# Patient Record
Sex: Female | Born: 1960
Health system: Southern US, Community
[De-identification: ages and names within clinical notes are randomized; demographics above are authoritative.]

## PROBLEM LIST (undated history)

## (undated) DIAGNOSIS — N390 Urinary tract infection, site not specified: Secondary | ICD-10-CM

## (undated) DIAGNOSIS — Z8542 Personal history of malignant neoplasm of other parts of uterus: Secondary | ICD-10-CM

## (undated) DIAGNOSIS — Z803 Family history of malignant neoplasm of breast: Secondary | ICD-10-CM

## (undated) DIAGNOSIS — R7303 Prediabetes: Secondary | ICD-10-CM

## (undated) DIAGNOSIS — E042 Nontoxic multinodular goiter: Secondary | ICD-10-CM

## (undated) DIAGNOSIS — K219 Gastro-esophageal reflux disease without esophagitis: Secondary | ICD-10-CM

## (undated) DIAGNOSIS — K8020 Calculus of gallbladder without cholecystitis without obstruction: Secondary | ICD-10-CM

## (undated) DIAGNOSIS — I4891 Unspecified atrial fibrillation: Principal | ICD-10-CM

## (undated) DIAGNOSIS — B019 Varicella without complication: Secondary | ICD-10-CM

## (undated) HISTORY — PX: TONSILLECTOMY: SUR1361

## (undated) HISTORY — DX: Gastro-esophageal reflux disease without esophagitis: K21.9

## (undated) HISTORY — DX: Family history of malignant neoplasm of breast: Z80.3

## (undated) HISTORY — DX: Calculus of gallbladder without cholecystitis without obstruction: K80.20

## (undated) HISTORY — DX: Varicella without complication: B01.9

## (undated) HISTORY — DX: Urinary tract infection, site not specified: N39.0

## (undated) HISTORY — DX: Personal history of malignant neoplasm of other parts of uterus: Z85.42

---

## 1998-08-12 ENCOUNTER — Other Ambulatory Visit: Admission: RE | Admit: 1998-08-12 | Discharge: 1998-08-12 | Payer: Self-pay | Admitting: Obstetrics and Gynecology

## 1999-07-20 ENCOUNTER — Inpatient Hospital Stay (HOSPITAL_COMMUNITY): Admission: AD | Admit: 1999-07-20 | Discharge: 1999-07-22 | Payer: Self-pay | Admitting: Obstetrics and Gynecology

## 1999-08-17 ENCOUNTER — Other Ambulatory Visit: Admission: RE | Admit: 1999-08-17 | Discharge: 1999-08-17 | Payer: Self-pay | Admitting: Obstetrics and Gynecology

## 2000-09-06 ENCOUNTER — Other Ambulatory Visit: Admission: RE | Admit: 2000-09-06 | Discharge: 2000-09-06 | Payer: Self-pay | Admitting: Obstetrics and Gynecology

## 2001-09-10 ENCOUNTER — Other Ambulatory Visit: Admission: RE | Admit: 2001-09-10 | Discharge: 2001-09-10 | Payer: Self-pay | Admitting: Obstetrics and Gynecology

## 2002-09-15 ENCOUNTER — Other Ambulatory Visit: Admission: RE | Admit: 2002-09-15 | Discharge: 2002-09-15 | Payer: Self-pay | Admitting: Obstetrics and Gynecology

## 2003-09-22 ENCOUNTER — Other Ambulatory Visit: Admission: RE | Admit: 2003-09-22 | Discharge: 2003-09-22 | Payer: Self-pay | Admitting: Obstetrics and Gynecology

## 2004-11-01 ENCOUNTER — Other Ambulatory Visit: Admission: RE | Admit: 2004-11-01 | Discharge: 2004-11-01 | Payer: Self-pay | Admitting: Endocrinology

## 2010-08-08 ENCOUNTER — Ambulatory Visit: Payer: Self-pay | Admitting: Genetic Counselor

## 2010-10-20 ENCOUNTER — Ambulatory Visit: Payer: Self-pay | Admitting: Genetic Counselor

## 2011-02-21 ENCOUNTER — Ambulatory Visit: Payer: BC Managed Care – PPO | Attending: Gynecology | Admitting: Gynecology

## 2011-02-21 DIAGNOSIS — C549 Malignant neoplasm of corpus uteri, unspecified: Secondary | ICD-10-CM | POA: Insufficient documentation

## 2011-02-21 DIAGNOSIS — Z806 Family history of leukemia: Secondary | ICD-10-CM | POA: Insufficient documentation

## 2011-02-21 DIAGNOSIS — Z79899 Other long term (current) drug therapy: Secondary | ICD-10-CM | POA: Insufficient documentation

## 2011-02-21 DIAGNOSIS — Z803 Family history of malignant neoplasm of breast: Secondary | ICD-10-CM | POA: Insufficient documentation

## 2011-02-21 DIAGNOSIS — Z8049 Family history of malignant neoplasm of other genital organs: Secondary | ICD-10-CM | POA: Insufficient documentation

## 2011-02-23 NOTE — Consult Note (Signed)
[name], PENADO NO.:  [id number]  MEDICAL RECORD NO.:  [id number]           PATIENT TYPE:  O  LOCATION:  DAY                          FACILITY:  [facility]  PHYSICIAN:  De Blanch, M.D.DATE OF BIRTH:  05-20-1961  DATE OF CONSULTATION:  02/21/2011 DATE OF DISCHARGE:                                CONSULTATION   GYN/Oncology Clinic  CHIEF COMPLAINT:  Endometrial cancer.  HISTORY OF PRESENT ILLNESS:  [age]-year-old white married female seen in consultation at the request of Dr. Juliene Pina regarding management of a newly diagnosed well-differentiated endometrial carcinoma.  The patient continues to have regular cyclic menses, although over the last several months, have become heavy and more recently she developed intermenstrual spotting.  She underwent an ultrasound that showed a 1.5 centimeter polyp and irregular wall thickening.  Endometrial biopsies obtained showing a grade I endometrial carcinoma in the background of complex endometrial hyperplasia with atypia.  Patient has occasional cramping.  Has no other GI or GU symptoms. Functional status is excellent.  PAST MEDICAL HISTORY:  None.  PAST SURGICAL HISTORY:  Tonsils adenoidectomy.  CURRENT MEDICATIONS: 1. Celexa. 2. Zyrtec p.r.n.  DRUG ALLERGIES:  NAPROSYN (swelling).  GYNECOLOGIC HISTORY:  Gravida 4, para 3.  FAMILY HISTORY:  The patient's mother died of breast cancer in her 30s. There is no colon cancer in the family.  She has a maternal aunt with endometrial cancer and a paternal aunt with leukemia in her 9s.  SOCIAL HISTORY:  The patient is married.  She is paralegal.  She does not smoke.  REVIEW OF SYSTEMS:  Ten-point comprehensive review of systems negative except as noted above.  PHYSICAL EXAMINATION:  HEENT:  Negative. NECK:  Supple without thyromegaly.  There is no supraclavicular or inguinal adenopathy. ABDOMEN:  Soft, nontender.  No masses, organomegaly, ascites  or hernias noted. PELVIC EXAMINATION:  EGBUS, vagina, bladder, urethra are normal.  Cervix and uterus seem to be normal as well.  There is some blood from the cervical os.  No other lesions are noted.  Uterus is anterior and normal in shape, size and consistency.  Rectovaginal exam confirms.  IMPRESSION:  Grade I endometrial carcinoma associated with complex hyperplasia with atypia.  PLAN:  I discussed with the patient and her husband management options. I emphasized the cornerstone of therapy is to perform a hysterectomy and bilateral salpingo-oophorectomy.  Pros and cons of open versus laparoscopic versus robotic surgery have been discussed and the patient wishes to proceed with robotic surgery, which could be performed by Dr. [name] next Tuesday.  The risks of surgery including hemorrhage, infection, injury to adjacent viscera, thrombolic complications, anesthetic risks were outlined.  We also discussed the remote possibility that postoperative radiation therapy would be recommended based on surgical pathologic findings.  All the patient's questions were answered and we will proceed with planning and having her prepared for surgery on Feb 28, 2011.     De Blanch, M.D.     DC/MEDQ  D:  02/21/2011  T:  02/21/2011  Job:  [id number]  cc:   Telford Nab, R.N. [address] [place], [place] [address]  Armanda Heritage  Juliene Pina, MD Fax: [phone]  Electronically Signed by De Blanch M.D. on 02/23/2011 10:09:15 AM

## 2011-02-24 ENCOUNTER — Other Ambulatory Visit: Payer: Self-pay | Admitting: Gynecologic Oncology

## 2011-02-24 ENCOUNTER — Ambulatory Visit (HOSPITAL_COMMUNITY)
Admission: RE | Admit: 2011-02-24 | Discharge: 2011-02-24 | Disposition: A | Discharge status: Home / Self Care | Payer: BC Managed Care – PPO | Source: Ambulatory Visit | Attending: Obstetrics & Gynecology | Admitting: Obstetrics & Gynecology

## 2011-02-24 ENCOUNTER — Other Ambulatory Visit: Payer: Self-pay | Admitting: Obstetrics & Gynecology

## 2011-02-24 ENCOUNTER — Encounter (HOSPITAL_COMMUNITY): Payer: BC Managed Care – PPO

## 2011-02-24 DIAGNOSIS — Z01811 Encounter for preprocedural respiratory examination: Secondary | ICD-10-CM

## 2011-02-24 DIAGNOSIS — Z01812 Encounter for preprocedural laboratory examination: Secondary | ICD-10-CM | POA: Insufficient documentation

## 2011-02-24 DIAGNOSIS — Z01818 Encounter for other preprocedural examination: Secondary | ICD-10-CM | POA: Insufficient documentation

## 2011-02-24 LAB — COMPREHENSIVE METABOLIC PANEL
ALT: 17 U/L (ref 0–35)
AST: 15 U/L (ref 0–37)
Albumin: 3.8 g/dL (ref 3.5–5.2)
Alkaline Phosphatase: 92 U/L (ref 39–117)
Calcium: 8.6 mg/dL (ref 8.4–10.5)
GFR calc Af Amer: >60 mL/min (ref >60)
Potassium: 4.4 mEq/L (ref 3.5–5.1)
Sodium: 139 mEq/L (ref 135–145)
Total Protein: 6.9 g/dL (ref 6.0–8.3)

## 2011-02-24 LAB — CBC
HCT: 38.8 % (ref 36.0–46.0)
Platelets: 249 10*3/uL (ref 150–400)
RBC: 4.27 MIL/uL (ref 3.87–5.11)
RDW: 14.3 % (ref 11.5–15.5)
WBC: 7.9 10*3/uL (ref 4.0–10.5)

## 2011-02-24 LAB — DIFFERENTIAL
Basophils Absolute: 0.1 10*3/uL (ref 0.0–0.1)
Eosinophils Absolute: 0.5 10*3/uL (ref 0.0–0.7)
Eosinophils Relative: 6 % — ABNORMAL HIGH (ref 0–5)
Lymphocytes Relative: 24 % (ref 12–46)
Lymphs Abs: 1.9 10*3/uL (ref 0.7–4.0)
Neutrophils Relative %: 63 % (ref 43–77)

## 2011-02-24 LAB — TYPE AND SCREEN: ABO/RH(D): A POS

## 2011-02-24 LAB — SURGICAL PCR SCREEN: Staphylococcus aureus: POSITIVE — AB

## 2011-02-28 ENCOUNTER — Other Ambulatory Visit: Payer: Self-pay | Admitting: Gynecologic Oncology

## 2011-02-28 ENCOUNTER — Inpatient Hospital Stay (HOSPITAL_COMMUNITY)
Admission: RE | Admit: 2011-02-28 | Discharge: 2011-03-03 | DRG: 354 | Disposition: A | Discharge status: Home / Self Care | Payer: BC Managed Care – PPO | Source: Ambulatory Visit | Attending: Obstetrics & Gynecology | Admitting: Obstetrics & Gynecology

## 2011-02-28 DIAGNOSIS — C549 Malignant neoplasm of corpus uteri, unspecified: Principal | ICD-10-CM | POA: Diagnosis present

## 2011-02-28 DIAGNOSIS — K8020 Calculus of gallbladder without cholecystitis without obstruction: Secondary | ICD-10-CM

## 2011-02-28 DIAGNOSIS — E875 Hyperkalemia: Secondary | ICD-10-CM | POA: Diagnosis not present

## 2011-02-28 HISTORY — DX: Calculus of gallbladder without cholecystitis without obstruction: K80.20

## 2011-02-28 HISTORY — PX: ABDOMINAL HYSTERECTOMY: SHX81

## 2011-03-01 LAB — BASIC METABOLIC PANEL
BUN: 5 mg/dL — ABNORMAL LOW (ref 6–23)
CO2: 28 mEq/L (ref 19–32)
GFR calc Af Amer: >60 mL/min (ref >60)
GFR calc non Af Amer: >60 mL/min (ref >60)
Glucose, Bld: 124 mg/dL — ABNORMAL HIGH (ref 70–99)

## 2011-03-01 LAB — CBC
HCT: 32.7 % — ABNORMAL LOW (ref 36.0–46.0)
Hemoglobin: 10.8 g/dL — ABNORMAL LOW (ref 12.0–15.0)
MCH: 30.1 pg (ref 26.0–34.0)
MCHC: 33 g/dL (ref 30.0–36.0)

## 2011-03-01 NOTE — Consult Note (Signed)
  [name], Melinda Carlson NO.:  [id number]  MEDICAL RECORD NO.:  [id number]           PATIENT TYPE:  O  LOCATION:  DAYL                         FACILITY:  [facility]  PHYSICIAN:  Currie Paris, M.D.DATE OF BIRTH:  05-01-1961  DATE OF CONSULTATION: DATE OF DISCHARGE:                                CONSULTATION   REASON FOR CONSULTATION:  Gallstones.  HISTORY OF PRESENT ILLNESS:  This is a 50 year old lady I was asked to see in the operating room.  She is undergoing open hysterectomy and on palpation of the abdomen, some abnormality was felt in the gallbladder. We were asked to evaluate that.  As far as her primary physicians were aware that the patient was having biliary tract symptoms.  Examination, I scrubbed down and the abdomen was opened.  I was able to palpate easily the gallbladder.  The incision was a lower midline, had difficulty visualizing it.  However, the gallbladder seemed to just have multiple small stones, some of which looked conglomerate.  Gallbladder was soft, nondistended and not inflamed.  IMPRESSION:  Gallstones, symptomatology unknown.  PLAN:  I thought at this point, we did not need to add a cholecystectomy to her procedure without further evaluation and knowledge of whether she was symptomatic or not.  I have reviewed that with her physicians and felt that we would be happy to follow her either in the hospital or after discharge to make decision for elective cholecystectomy.     Currie Paris, M.D.     CJS/MEDQ  D:  02/28/2011  T:  02/28/2011  Job:  [id number]  Electronically Signed by Cyndia Bent M.D. on 03/01/2011 06:13:31 PM

## 2011-03-01 NOTE — Op Note (Signed)
[name], Melinda Carlson NO.:  [id number]  MEDICAL RECORD NO.:  [id number]           PATIENT TYPE:  I  LOCATION:  1527                         FACILITY:  [facility]  PHYSICIAN:  [name], MD     DATE OF BIRTH:  1961-07-01  DATE OF PROCEDURE:  02/28/2011 DATE OF DISCHARGE:                              OPERATIVE REPORT   PREOPERATIVE DIAGNOSIS:  Endometrial cancer.  POSTOPERATIVE DIAGNOSIS:  Endometrial cancer involving cervix.  PROCEDURE:  Total abdominal hysterectomy, bilateral salpingo- oophorectomy, bilateral pelvic lymph node dissection and periaortic lymph node dissection.  ANESTHESIA:  General endotracheal.  FINDINGS:  Upon instrumentation of the uterus and cervix for placement of the uterine manipulator, tissue was noted high in the endocervical canal.  A biopsy was obtained and sent for frozen section which returned with endometrioid adenocarcinoma involving the endocervical canal.  As such, the procedure was changed to an open procedure.  The intraoperative findings were notable for uterus measuring approximately 10 cm, bilateral normal appearing adnexa.  The gallbladder was noted to be approximately 8 cm with a 5-cm gallstone and multiple other smaller stones.  Intraoperative assessment was obtained by general surgery and at that point, there was no indication for immediate cholecystectomy but followup was recommended in 3 to 4 weeks.  DESCRIPTION OF PROCEDURE:  The patient was taken to operating room and placed under general endotracheal anesthesia without any difficulty.  An endocervical biopsy was obtained and confirmed the presence of metastatic endometrial adenocarcinoma to the endocervix.  The patient was prepped and draped in usual sterile fashion.  A midline vertical incision was made, extending just above the umbilicus.  The abdomen was entered and the findings as noted above.  Bookwalter retractor was placed and pelvic contents packed  into the upper abdomen.  Bilateral round ligaments were dissected bilaterally.  Retroperitoneal space entered.  Ureters identified bilaterally.  The infundibulopelvic ligaments were clamped, transected and ligated.  The broad ligament was skeletonized.  Uterine vessels were skeletonized.  The bladder flap was dissected off the lower uterine segment.  There was bleeding at this time, which was controlled with Bovie cautery.  The paracervical tissues were clamped, transected and ligated.  Clamps were placed inferior to the cervix and the specimen removed from the pelvic cavity.  Vaginal cuff was closed with 0 PDS suture in standard Berman fashion.  Pelvis was irrigated and drained and hemostasis was obtained.  The retroperitoneal spaces were entered first on the left, the ureter identified and lymph nodes removed from the distal circumflex femoral vein to the level of the bifurcation of the common iliac artery, lateral borders of the genitofemoral nerve laterally, the ureter medially, the obturator nerve inferiorly.  Hemostasis was achieved with clips.  The same lymph node dissection was performed on the contralateral, i.e., the right side.  The left common iliac lymph nodes were removed as distally as possible and sent as separate specimen.  Right periaortic lymph node dissection was performed with entry into the vena cava.  Hemostasis was achieved with clips and Gelfoam placed over the repair. Laps were removed from the abdomen.  Abdomen and pelvis were  copiously irrigated and drained. Hemostasis was assured at all surgical site.  Fascia was closed in mass closure using a loop PDS suture with sutures overlapping in the midline. Subcutaneous tissues were copiously irrigated and drained.  Subcutaneous tissues were reapproximated with 2-0 Vicryl suture.  The skin was closed with a running subcuticular suture.  Sponges, instrument and needle count correct x3.  Estimated blood loss 400  mL.  SPECIMENS:  Cervical biopsy, uterus, cervix, ovaries, tubes, bilateral pelvic lymph nodes, right periaortic lymph node dissection, left common iliac lymph node dissection.  DRAINS:  Foley draining clear urine.  DISPOSITION:  The patient is extubated and taken to recovery room in stable condition.     [name], MD     WB/MEDQ  D:  02/28/2011  T:  02/28/2011  Job:  [id number]  cc:   Telford Nab, R.N. [address] [place], [place] [address]  Darryl Nestle, MD Electronically Signed by [name] MD on 03/01/2011 01:07:27 PM

## 2011-03-02 LAB — BASIC METABOLIC PANEL
CO2: 27 mEq/L (ref 19–32)
Chloride: 109 mEq/L (ref 96–112)
GFR calc Af Amer: >60 mL/min (ref >60)
Potassium: 4 mEq/L (ref 3.5–5.1)
Sodium: 140 mEq/L (ref 135–145)

## 2011-03-07 ENCOUNTER — Ambulatory Visit: Payer: BC Managed Care – PPO | Admitting: Gynecologic Oncology

## 2011-03-07 ENCOUNTER — Ambulatory Visit: Payer: BC Managed Care – PPO | Attending: Gynecologic Oncology | Admitting: Gynecologic Oncology

## 2011-03-07 DIAGNOSIS — C549 Malignant neoplasm of corpus uteri, unspecified: Secondary | ICD-10-CM | POA: Insufficient documentation

## 2011-03-07 DIAGNOSIS — Z90710 Acquired absence of both cervix and uterus: Secondary | ICD-10-CM | POA: Insufficient documentation

## 2011-03-07 DIAGNOSIS — Z9079 Acquired absence of other genital organ(s): Secondary | ICD-10-CM | POA: Insufficient documentation

## 2011-03-08 NOTE — Consult Note (Signed)
  NAME:  Melinda, Carlson NO.:  [id number]  MEDICAL RECORD NO.:  [id number]           PATIENT TYPE:  O  LOCATION:  GYN                          FACILITY:  [facility]  PHYSICIAN:  [name], MD     DATE OF BIRTH:  12-07-1960  DATE OF CONSULTATION: DATE OF DISCHARGE:                                CONSULTATION   REASON FOR VISIT:  Postoperative check.  HISTORY OF PRESENT ILLNESS:  This is a 50 year old para 3, who presented with uterine bleeding.  An endometrial biopsy was found to have an endometrioid type FIGO grade I cancer, transferred to the operating room on Feb 28, 2011 for a planned robotic approach; however, on insertion of uterine manipulating device, adenocarcinoma was noted within the endocervical canal.  The procedure was then performed open and she underwent a total abdominal hysterectomy, bilateral salpingo- oophorectomy, bilateral pelvic lymph node dissection and periaortic lymph node sampling.  Final pathology was consistent with a stage IA, grade I endometrioid adenocarcinoma.  The tumor involved endocervical glands without any endocervical stromal invasion.  There is no lymphovascular space invasion.  Melinda Carlson presents today for evaluation of her abdominal incision, for which there had been separation of the skin edges.  She denies any nausea, vomiting, abdominal pain or vaginal bleeding.  PHYSICAL EXAMINATION:  GENERAL:  Well-developed female, in no acute distress. VITAL SIGNS:  Weight 206 pounds, blood pressure 126/80, pulse of 84. ABDOMEN:  Soft, nontender.  There is ecchymosis around particularly the inferior part of the incision, superior approximately 3 cm skin separation. PELVIC EXAMINATION:  The cuff is intact without any vaginal bleeding.  IMPRESSION:  Stage IA, grade I endometrial endometrioid adenocarcinoma with endocervical glandular involvement and myometrial invasion 5 cm where the myometrium is 1.3 cm in thickness, maximal  tumor size of 5.4 cm.  At this visit, the interrupted area of the abdominal skin incision separation was prepped, infiltrated with 1% lidocaine without epinephrine and approximately 6 interrupted sutures placed.  The pathology was discussed with Melinda Carlson and she is aware that no adjuvant therapy is required based on these findings.  She will follow up in 2 weeks for suture removal.     [name], MD     WB/MEDQ  D:  03/07/2011  T:  03/07/2011  Job:  [id number]  cc:   Darryl Nestle, MD Fax: [phone]  Telford Nab, R.N. [address] [place], [place] [address]  Electronically Signed by [name] MD on 03/08/2011 08:59:05 AM

## 2011-03-08 NOTE — Discharge Summary (Signed)
  [name], Melinda Carlson NO.:  [id number]  MEDICAL RECORD NO.:  [id number]           PATIENT TYPE:  I  LOCATION:  1527                         FACILITY:  [facility]  PHYSICIAN:  Roseanna Rainbow, M.D.DATE OF BIRTH:  1961/07/31  DATE OF ADMISSION:  02/28/2011 DATE OF DISCHARGE:  03/03/2011                              DISCHARGE SUMMARY   CHIEF COMPLAINT:  The patient is a 50 year old who presents for operative management of an endometrial cancer.  Please see the dictated history and physical for further details.  HOSPITAL COURSE:  The patient was admitted and underwent a total abdominal hysterectomy, bilateral salpingo-oophorectomy, bilateral pelvic lymph node dissection and periaortic lymph node dissection.  Of note, intraoperatively an incidental finding was cholelithiasis on palpation.  An intraoperative consultation was obtained from General Surgery.  Please see their dictated consultation.  On postoperative day #1, a hemoglobin was 13.1, a potassium was 5.2, a creatinine was 1.02 which was stable from preoperative testing.  In light of the borderline elevated potassium, the potassium was removed from the IV fluids infusing.  A repeat basic metabolic profile was normal.  On postoperative day #2, there was some serosanguineous drainage noted from the apex of the incision.  The Steri-Strips were removed.  There was a small separation in the skin that was approximately 1 to 2 cm long, beginning from the apex of the incision.  The incision was cleansed. The skin edges were reapproximated and Dermabond was applied.  The patient's diet was gradually advanced.  The remainder of her hospital course was uneventful.  She was discharged to home on postoperative day #3.  DISCHARGE DIAGNOSIS:  Endometrial carcinoma FIGO grade 1, stage IA, cholelithiasis.  PROCEDURES:  Total abdominal hysterectomy, bilateral salpingo- oophorectomy, bilateral pelvic lymph node dissection  and periaortic lymph node dissection.  CONDITION:  Stable.  DIET:  Regular.  ACTIVITY:  Pelvic rest.  Progressive activity.  MEDICATIONS: 1. Biotin 5000 mcg one tablet daily. 2. Multivitamin one tablet daily. 3. Celexa 20 mg one tablet q.h.s. 4. Zyrtec 10 mg one tablet daily. 5. Percocet 5/325 one to two tablets every 6 hours as needed.  FOLLOWUP:  The patient was to follow up in the Gynecology/Oncology office.  The patient was also to follow up with General Surgery.     Roseanna Rainbow, M.D.     LAJ/MEDQ  D:  03/03/2011  T:  03/03/2011  Job:  [id number]  cc:   Telford Nab, R.N. [address] [place], [place] [address]  Darryl Nestle, MD Fax: [phone]  Electronically Signed by Antionette Char M.D. on 03/08/2011 09:41:07 PM

## 2011-04-06 ENCOUNTER — Ambulatory Visit: Payer: BC Managed Care – PPO | Attending: Gynecologic Oncology | Admitting: Gynecologic Oncology

## 2011-04-06 DIAGNOSIS — Z90710 Acquired absence of both cervix and uterus: Secondary | ICD-10-CM | POA: Insufficient documentation

## 2011-04-06 DIAGNOSIS — Z9079 Acquired absence of other genital organ(s): Secondary | ICD-10-CM | POA: Insufficient documentation

## 2011-04-06 DIAGNOSIS — C549 Malignant neoplasm of corpus uteri, unspecified: Secondary | ICD-10-CM | POA: Insufficient documentation

## 2011-04-07 NOTE — Consult Note (Signed)
  [name], Melinda Carlson NO.:  [id number]  MEDICAL RECORD NO.:  [id number]  LOCATION:  GYN                          FACILITY:  [facility]  PHYSICIAN:  [name], MD     DATE OF BIRTH:  Aug 03, 1961  DATE OF CONSULTATION:  04/06/2011 DATE OF DISCHARGE:                                CONSULTATION   REASON FOR VISIT:  Postoperative check status post endometrial cancer staging.  HISTORY OF PRESENT ILLNESS:  This is a 50 year old diagnosed with a grade 1 endometrial cancer on February 06, 2011.  She underwent exploratory laparotomy, total abdominal hysterectomy bilateral salpingo- oophorectomy, bilateral pelvic and paraaortic lymph node dissection. Final pathology was consistent with a stage IA grade 1 endometrioid adenocarcinoma.  Tumor involved the endocervical glands without any endometrial stromal invasion.  There was no lymphovascular space involvement or cervical stromal involvement.  Tumor was noted to involve the uterus 0.5 cm where the myometrium was 1.3 cm.  Her postoperativecourse was complicated by separation of the superior aspect of the incision.  Interrupted sutures were placed.  PAST MEDICAL HISTORY:  Stage IA endometrial cancer.  PAST SURGICAL HISTORY:  Tonsillectomy remotely, endometrial cancer staging in May of 2012  SOCIAL HISTORY:  The patient is a IT consultant.  Does not smoke.  FAMILY HISTORY:  Unchanged.  REVIEW OF SYSTEMS:  No nausea, vomiting, fever, chills.  Reports recent UTI with flank tenderness, treated with antibiotics and since resolved. No swelling in the lower extremities.  No cough, shortness of breath, diarrhea, or constipation.  Otherwise, 10-point review of systems is negative.  PHYSICAL EXAMINATION:  GENERAL:  Well-developed female, in no acute distress. VITAL SIGNS:  Weight 204 pounds, blood pressure 120/78, pulse of 72. CHEST:  Clear to auscultation. ABDOMEN:  Soft, nontender without any masses.  Incision well healed.   No evidence of a hernia. BACK:  No CVA tenderness. LYMPH NODE SURVEY:  No cervical, supraclavicular or inguinal adenopathy. EXTREMITIES:  No clubbing, cyanosis or edema. PELVIC EXAMINATION:  Normal external genitalia, Bartholin's, urethra and Skene's.  Vaginal cuff intact.  No cul-de-sac fullness.  No tenderness.  IMPRESSION:  Stage I grade 1 endometrial adenocarcinoma.  The patient has been advised to follow up with Dr. Juliene Pina in 6 months and with GYN Oncology service in a year.     [name], MD     WB/MEDQ  D:  04/06/2011  T:  04/06/2011  Job:  [id number]  cc:   Darryl Nestle, MD Fax: [phone]  Telford Nab, R.N. [phone] [place], [place] [address]  Electronically Signed by [name] MD on 04/07/2011 08:00:32 AM

## 2011-04-17 ENCOUNTER — Other Ambulatory Visit: Payer: Self-pay | Admitting: Radiology

## 2011-06-08 ENCOUNTER — Encounter (INDEPENDENT_AMBULATORY_CARE_PROVIDER_SITE_OTHER): Payer: Self-pay | Admitting: Surgery

## 2011-06-13 ENCOUNTER — Encounter (INDEPENDENT_AMBULATORY_CARE_PROVIDER_SITE_OTHER): Payer: Self-pay | Admitting: Surgery

## 2011-06-13 ENCOUNTER — Ambulatory Visit (INDEPENDENT_AMBULATORY_CARE_PROVIDER_SITE_OTHER): Payer: BC Managed Care – PPO | Admitting: Surgery

## 2011-06-13 VITALS — BP 128/88 | HR 72

## 2011-06-13 DIAGNOSIS — K8020 Calculus of gallbladder without cholecystitis without obstruction: Secondary | ICD-10-CM

## 2011-06-13 NOTE — Progress Notes (Signed)
NAME: Melinda Carlson [facility]                                                                                      DOB: 03-17-1961 DATE: 06/13/2011               MRN: [id number]   CC:  Chief Complaint  Patient presents with  . Other    Re-eval gallbladder/ready for sx    HPI: Melinda Carlson is a 50 y.o.  female who presents with GallstonesThis patient was found to have gallstones while undergoing hysterectomy for a stage I endometrial carcinoma in May of 2012. She's had very minimal symptoms. We followed her back today to see half she was doing and she continues to have minimal symptoms but really would like to have a cholecystectomy.  She's had no problems postoperatively since her hysterectomy related to that surgery. She is scheduled to see her OB/GYN in about two weeks.Marland Kitchen PMH:  has a past medical history of History of uterine cancer; Cancer; GERD (gastroesophageal reflux disease); and Family history of breast cancer.  PSH:  has past surgical history that includes Tonsillectomy and Abdominal hysterectomy (02/28/11).  ALLERGIES:  Allergies  Allergen Reactions  . Naprosyn (Naproxen) Swelling    MEDICATIONS:  Current Outpatient Prescriptions  Medication Sig Dispense Refill  . Biotin 5000 MCG CAPS Take 5,000 mcg by mouth daily.        . cetirizine (ZYRTEC) 10 MG tablet Take 10 mg by mouth daily.        . citalopram (CELEXA) 10 MG tablet Take 10 mg by mouth daily.          ROS; Her 12 point review of systems is negative except as noted above. EXAM: GENERAL: The patient is alert, oriented, and generally healthy-appearing, NAD. Mood and affect are normal.  HEENT: The head is normocephalic, the eyes nonicteric, the pupils were round regular and equal. EOMs are normal. Pharynx normal. Dentition good.  NECK: The neck is supple and there are no masses or thyromegaly.  LUNGS: Normal respirations and clear to auscultation.  HEART: Regular rhythm, with no murmurs rubs or gallops. Pulses are intact  carotid dorsalis pedis and posterior tibial. No significant varicosities are noted.   ABDOMEN: Soft, flat, and nontender. No masses or organomegaly is noted. No hernias are noted. Bowel sounds are normal.Well healed lower abdominal midline scar from recent hysterectomy  EXTREMITIES: Good range of motion, no edema.   DATA REVIEWED:  I have reviewed old notes and data  IMPRESSION: Gallstones, minimally syptomatice  PLAN:  Lap chole and cholangiogram. I have discussed the indications for laparoscopic cholecystectomy with this patient and provided some educational material. We have discussed the risks of surgery, including general risks such as bleeding, infection lung and heart issues etc. We have also discussed the potential for injuries to other organs, bile duct leaks, and other unexpected events. We have also talked about the fact that this may need to be converted to open under certain circumstances.  Patient understands this and wishes to proceed to schedule surgery. I believe all of .his questions have been answered.

## 2011-06-30 ENCOUNTER — Encounter (HOSPITAL_COMMUNITY)
Admission: RE | Admit: 2011-06-30 | Discharge: 2011-06-30 | Disposition: A | Discharge status: Home / Self Care | Payer: BC Managed Care – PPO | Source: Ambulatory Visit | Attending: Surgery | Admitting: Surgery

## 2011-06-30 ENCOUNTER — Other Ambulatory Visit (INDEPENDENT_AMBULATORY_CARE_PROVIDER_SITE_OTHER): Payer: Self-pay | Admitting: Surgery

## 2011-06-30 DIAGNOSIS — K8020 Calculus of gallbladder without cholecystitis without obstruction: Secondary | ICD-10-CM

## 2011-06-30 LAB — URINALYSIS, ROUTINE W REFLEX MICROSCOPIC
Glucose, UA: NEGATIVE mg/dL
Nitrite: NEGATIVE
Protein, ur: NEGATIVE mg/dL

## 2011-06-30 LAB — COMPREHENSIVE METABOLIC PANEL
Albumin: 4.1 g/dL (ref 3.5–5.2)
Alkaline Phosphatase: 104 U/L (ref 39–117)
BUN: 9 mg/dL (ref 6–23)
Calcium: 9.6 mg/dL (ref 8.4–10.5)
Creatinine, Ser: 0.71 mg/dL (ref 0.50–1.10)
Potassium: 3.8 mEq/L (ref 3.5–5.1)
Total Protein: 7.3 g/dL (ref 6.0–8.3)

## 2011-06-30 LAB — DIFFERENTIAL
Basophils Absolute: 0.1 10*3/uL (ref 0.0–0.1)
Basophils Relative: 1 % (ref 0–1)
Monocytes Absolute: 0.8 10*3/uL (ref 0.1–1.0)
Neutro Abs: 6.1 10*3/uL (ref 1.7–7.7)
Neutrophils Relative %: 61 % (ref 43–77)

## 2011-06-30 LAB — CBC
Hemoglobin: 13.7 g/dL (ref 12.0–15.0)
MCHC: 34.9 g/dL (ref 30.0–36.0)
RBC: 4.44 MIL/uL (ref 3.87–5.11)

## 2011-06-30 LAB — URINE MICROSCOPIC-ADD ON

## 2011-06-30 LAB — SURGICAL PCR SCREEN
MRSA, PCR: NEGATIVE
Staphylococcus aureus: POSITIVE — AB

## 2011-07-03 ENCOUNTER — Telehealth (INDEPENDENT_AMBULATORY_CARE_PROVIDER_SITE_OTHER): Payer: Self-pay | Admitting: General Surgery

## 2011-07-03 HISTORY — PX: LAPAROSCOPIC CHOLECYSTECTOMY W/ CHOLANGIOGRAPHY: SUR757

## 2011-07-03 NOTE — Telephone Encounter (Signed)
Labs okay for surgery faxed to pre-op.  

## 2011-07-04 ENCOUNTER — Other Ambulatory Visit (INDEPENDENT_AMBULATORY_CARE_PROVIDER_SITE_OTHER): Payer: Self-pay | Admitting: Surgery

## 2011-07-04 ENCOUNTER — Ambulatory Visit (HOSPITAL_COMMUNITY): Payer: BC Managed Care – PPO

## 2011-07-04 ENCOUNTER — Ambulatory Visit (HOSPITAL_COMMUNITY)
Admission: RE | Admit: 2011-07-04 | Discharge: 2011-07-04 | Disposition: A | Discharge status: Home / Self Care | Payer: BC Managed Care – PPO | Source: Ambulatory Visit | Attending: Surgery | Admitting: Surgery

## 2011-07-04 ENCOUNTER — Encounter (INDEPENDENT_AMBULATORY_CARE_PROVIDER_SITE_OTHER): Payer: Self-pay | Admitting: Surgery

## 2011-07-04 DIAGNOSIS — K8010 Calculus of gallbladder with chronic cholecystitis without obstruction: Secondary | ICD-10-CM

## 2011-07-04 DIAGNOSIS — Z01818 Encounter for other preprocedural examination: Secondary | ICD-10-CM | POA: Insufficient documentation

## 2011-07-04 DIAGNOSIS — Z01812 Encounter for preprocedural laboratory examination: Secondary | ICD-10-CM | POA: Insufficient documentation

## 2011-07-04 DIAGNOSIS — K8020 Calculus of gallbladder without cholecystitis without obstruction: Secondary | ICD-10-CM | POA: Insufficient documentation

## 2011-07-06 NOTE — Op Note (Signed)
NAME:  IYANI, DRESNER NO.:  [id number]  MEDICAL RECORD NO.:  [id number]  LOCATION:  [facility]                         FACILITY:  [facility]  PHYSICIAN:  Currie Paris, M.D.DATE OF BIRTH:  1961/01/29  DATE OF PROCEDURE:  07/04/2011 DATE OF DISCHARGE:                              OPERATIVE REPORT   PREOPERATIVE DIAGNOSIS:  Gallstones.  POSTOPERATIVE DIAGNOSIS:  Gallstones.  PROCEDURE:  Laparoscopic cholecystectomy with intraoperative cholangiogram.  SURGEON:  Currie Paris, MD  ASSISTANT:  Dr. Michaell Cowing.  ANESTHESIA:  General endotracheal.  CLINICAL HISTORY:  This is a 50 year old lady incidentally found to have gallstones when undergoing GYN surgery several months ago.  In retrospect, it was noted that she has been somewhat symptomatic and she came in today for elective cholecystectomy.  DESCRIPTION OF PROCEDURE:  The patient was seen in the holding area and had no further questions.  We confirmed plans for the surgery as noted above.  The patient was taken to the operating room and after satisfactory general endotracheal anesthesia had been obtained, the abdomen was prepped and draped and the time-out was done.  Because she had a previous lower midline incision going above the umbilicus, I decided to use an OptiView to get into the abdomen.  I made a 1 cm incision just to the right of midline and near the xiphoid and used a 10 x 12 OptiView and was able to safely entered the abdomen under direct vision.  The abdomen was insufflated.  I could look back and see some adhesions below the umbilicus, and using some local and a needle I was able to identify that the umbilicus was above these adhesions.  I then made a 1 cm incision in the umbilicus and under direct vision, put a 10 x 11 trocar into the abdomen there and then again under direct vision two 5s laterally.  The gallbladder was contracted around multiple stones and there were multiple omental  adhesions and these were all taken down.  The area around the cystic duct was opened anteriorly and posterior, I dissected out long segment of cystic duct.  I could clearly see the junction with the gallbladder and the junction with the common duct and I could see the both common bile duct and the common hepatic duct nicely.  I could also see a segment of the cystic artery coming up posteriorly.  I put a clip on the cystic artery and one on the cystic duct at the junction with the gallbladder.  A Cook catheter was introduced percutaneously and placed in the cystic duct and operative angiography done.  We got filling of the cystic duct, common duct and duodenum on the initial run with no stones.  I placed this patient somewhat head down and did another two runs and got filling of the hepatic ducts and actually part of the pancreatic duct as well.  The duct was somewhat large, but there were no evidence of filling defects.  I had the films reviewed by the radiologist who concurred.  Catheter was removed from the cystic duct and 3 clips placed on it and was completely divided.  The artery was dissected out further and additional clips were placed  on that and divided.  Another posterior branch was clipped and divided.  The gallbladder was brought out, it was disconnected from below to above using coagulation current of the cautery.  It was placed in a bag and brought out through the umbilical port site.  I had opened the fascial a little bit more here because this open was too small and break up the stones to get it through, but eventually got the gallbladder out with a bag.  We did not spill any stones intra-abdominally.  I reinserted the catheter.  We irrigated to make sure everything was dry and everything looked okay.  I removed the epigastric port and used 0 Vicryl and the suture passer to put a fascial suture in here and close that.  Then with a 5-mm camera laterally I did the  same thing for the umbilical site.  The abdomen was deflated through the two 5 mm ports and they were removed.  Skin was closed with 4-0 Monocryl subcuticular plus Dermabond.  The patient tolerated the procedure well.  There were no complications. All counts were correct.     Currie Paris, M.D.     CJS/MEDQ  D:  07/04/2011  T:  07/04/2011  Job:  [id number]  Electronically Signed by Cyndia Bent M.D. on 07/06/2011 06:17:55 AM

## 2011-07-26 ENCOUNTER — Encounter (INDEPENDENT_AMBULATORY_CARE_PROVIDER_SITE_OTHER): Payer: Self-pay | Admitting: Surgery

## 2011-07-26 ENCOUNTER — Ambulatory Visit (INDEPENDENT_AMBULATORY_CARE_PROVIDER_SITE_OTHER): Payer: BC Managed Care – PPO | Admitting: Surgery

## 2011-07-26 VITALS — BP 128/74 | HR 76 | Temp 97.4°F | Resp 16 | Ht 67.75 in | Wt 218.0 lb

## 2011-07-26 DIAGNOSIS — K8020 Calculus of gallbladder without cholecystitis without obstruction: Secondary | ICD-10-CM

## 2011-07-26 NOTE — Progress Notes (Signed)
NAME: PATICIA MOSTER [facility]       DOB: 12/27/1960           DATE: 07/26/2011       ZOX:[id number]   CC: Postop laparoscopic cholecystectomy  HPI:  This patient underwent a laparoscopic cholecystectomy and operative cholangiogram on 07/04/2011. She is in for her first postoperative visit. She notes that her incisional pain has resolved. Her preoperative symptoms have improved. She is not having problems with nausea, vomiting, diarrhea, fevers, chills, or urinary symptoms. She is tolerating diet. She feels that she is progressing well and nearly back to normal. PE: General: The patient is alert and appears comfortable, NAD.  Abdomen: Soft and benign. The incisions are healing nicely. There are no apparent problems.  Data reviewed: IOC:  WNL Pathology:  Chronic cholecystitis and cholelitiasis  Impression:  The patient appears to be doing well, with improvement in her symptoms.  Plan:  She may resume full activity and regular diet. She  will followup with [place] on a p.r.n. basis. I did tell her that she may still have some foods that cause indigestion and ask her to call us if there are any questions, problems or concerns.

## 2011-10-18 ENCOUNTER — Other Ambulatory Visit: Payer: Self-pay | Admitting: Obstetrics & Gynecology

## 2011-10-18 DIAGNOSIS — E041 Nontoxic single thyroid nodule: Secondary | ICD-10-CM

## 2011-10-20 ENCOUNTER — Ambulatory Visit
Admission: RE | Admit: 2011-10-20 | Discharge: 2011-10-20 | Disposition: A | Discharge status: Home / Self Care | Payer: BC Managed Care – PPO | Source: Ambulatory Visit | Attending: Obstetrics & Gynecology | Admitting: Obstetrics & Gynecology

## 2011-10-20 DIAGNOSIS — E041 Nontoxic single thyroid nodule: Secondary | ICD-10-CM

## 2011-10-23 ENCOUNTER — Other Ambulatory Visit: Payer: Self-pay | Admitting: Obstetrics & Gynecology

## 2011-10-23 DIAGNOSIS — E041 Nontoxic single thyroid nodule: Secondary | ICD-10-CM

## 2011-10-25 ENCOUNTER — Other Ambulatory Visit (HOSPITAL_COMMUNITY)
Admission: RE | Admit: 2011-10-25 | Discharge: 2011-10-25 | Disposition: A | Discharge status: Home / Self Care | Payer: BC Managed Care – PPO | Source: Ambulatory Visit | Attending: Interventional Radiology | Admitting: Interventional Radiology

## 2011-10-25 ENCOUNTER — Ambulatory Visit
Admission: RE | Admit: 2011-10-25 | Discharge: 2011-10-25 | Disposition: A | Discharge status: Home / Self Care | Payer: BC Managed Care – PPO | Source: Ambulatory Visit | Attending: Obstetrics & Gynecology | Admitting: Obstetrics & Gynecology

## 2011-10-25 DIAGNOSIS — E041 Nontoxic single thyroid nodule: Secondary | ICD-10-CM

## 2012-03-05 ENCOUNTER — Other Ambulatory Visit: Payer: Self-pay | Admitting: Endocrinology

## 2012-03-05 DIAGNOSIS — E049 Nontoxic goiter, unspecified: Secondary | ICD-10-CM

## 2012-03-09 DIAGNOSIS — I4891 Unspecified atrial fibrillation: Principal | ICD-10-CM

## 2012-03-09 HISTORY — DX: Unspecified atrial fibrillation: I48.91

## 2012-04-03 ENCOUNTER — Inpatient Hospital Stay (HOSPITAL_COMMUNITY)
Admission: EM | Admit: 2012-04-03 | Discharge: 2012-04-04 | DRG: 139 | Disposition: A | Discharge status: Home / Self Care | Payer: BC Managed Care – PPO | Attending: Cardiology | Admitting: Cardiology

## 2012-04-03 ENCOUNTER — Encounter (HOSPITAL_COMMUNITY): Payer: Self-pay | Admitting: Emergency Medicine

## 2012-04-03 DIAGNOSIS — Z9089 Acquired absence of other organs: Secondary | ICD-10-CM

## 2012-04-03 DIAGNOSIS — Z90710 Acquired absence of both cervix and uterus: Secondary | ICD-10-CM

## 2012-04-03 DIAGNOSIS — I4891 Unspecified atrial fibrillation: Secondary | ICD-10-CM

## 2012-04-03 DIAGNOSIS — I498 Other specified cardiac arrhythmias: Secondary | ICD-10-CM | POA: Diagnosis not present

## 2012-04-03 DIAGNOSIS — R7309 Other abnormal glucose: Secondary | ICD-10-CM | POA: Diagnosis present

## 2012-04-03 DIAGNOSIS — E041 Nontoxic single thyroid nodule: Secondary | ICD-10-CM | POA: Diagnosis present

## 2012-04-03 HISTORY — DX: Nontoxic multinodular goiter: E04.2

## 2012-04-03 HISTORY — DX: Unspecified atrial fibrillation: I48.91

## 2012-04-03 HISTORY — DX: Prediabetes: R73.03

## 2012-04-03 LAB — BASIC METABOLIC PANEL
BUN: 10 mg/dL (ref 6–23)
Chloride: 103 mEq/L (ref 96–112)
GFR calc Af Amer: >90 mL/min (ref >90)
Glucose, Bld: 130 mg/dL — ABNORMAL HIGH (ref 70–99)
Potassium: 4 mEq/L (ref 3.5–5.1)
Sodium: 138 mEq/L (ref 135–145)

## 2012-04-03 LAB — CBC WITH DIFFERENTIAL/PLATELET
Hemoglobin: 13.3 g/dL (ref 12.0–15.0)
Lymphs Abs: 2.3 10*3/uL (ref 0.7–4.0)
Monocytes Relative: 5 % (ref 3–12)
Neutro Abs: 7.7 10*3/uL (ref 1.7–7.7)
Neutrophils Relative %: 71 % (ref 43–77)
Platelets: 220 10*3/uL (ref 150–400)
RBC: 4.3 MIL/uL (ref 3.87–5.11)
WBC: 10.9 10*3/uL — ABNORMAL HIGH (ref 4.0–10.5)

## 2012-04-03 MED ORDER — METOPROLOL TARTRATE 1 MG/ML IV SOLN
5.0000 mg | Freq: Once | INTRAVENOUS | Status: AC
  Administered 2012-04-04: 5 mg via INTRAVENOUS
  Filled 2012-04-03: qty 5

## 2012-04-03 MED ORDER — ONDANSETRON HCL 4 MG/2ML IJ SOLN
4.0000 mg | Freq: Once | INTRAMUSCULAR | Status: AC
  Administered 2012-04-03: 4 mg via INTRAVENOUS
  Filled 2012-04-03: qty 2

## 2012-04-03 MED ORDER — FLECAINIDE ACETATE 100 MG PO TABS
300.0000 mg | ORAL_TABLET | Freq: Once | ORAL | Status: AC
  Administered 2012-04-04: 300 mg via ORAL
  Filled 2012-04-03: qty 3

## 2012-04-03 MED ORDER — DILTIAZEM HCL 100 MG IV SOLR
5.0000 mg/h | Freq: Once | INTRAVENOUS | Status: AC
  Administered 2012-04-03: 5 mg/h via INTRAVENOUS
  Administered 2012-04-04: 15 mg/h via INTRAVENOUS

## 2012-04-03 NOTE — ED Notes (Signed)
Pt states that she has some n/v today and onset of right sided chest pain and weakness. Pt got 20mg  Cardizem by EMS

## 2012-04-03 NOTE — ED Provider Notes (Signed)
History     CSN: [id number]  Arrival date & time 04/03/12  2244   First MD Initiated Contact with Patient 04/03/12 2253      Chief Complaint  Patient presents with  . Chest Pain  . Emesis    (Consider location/radiation/quality/duration/timing/severity/associated sxs/prior treatment) Patient is a 51 y.o. female presenting with chest pain and vomiting. The history is provided by the patient.  Chest Pain Primary symptoms include vomiting.    Emesis   She noticed onset this evening of nausea and vomiting and a sense of indigestion. She laid down after vomiting and noticed that her heart was racing. Denies chest heaviness, tightness, pressure. She denies diaphoresis. She's never had anything like this before. She states she has had times when her heart is racing very briefly but nothing this severe nothing this prolonged period she called EMS and she was given a dose of Cardizem in route with some temporary drop in her heart rate. She has no known history of any cardiac problems. She does have a history of thyroid nodules but had normal thyroid function tests. Symptoms are described as severe. Nothing makes it better nothing makes it worse.  Past Medical History  Diagnosis Date  . History of uterine cancer   . Cancer   . GERD (gastroesophageal reflux disease)   . Family history of breast cancer     mother  . Gallstones 02/28/2011    Past Surgical History  Procedure Date  . Tonsillectomy   . Abdominal hysterectomy 02/28/11  . Laparoscopic cholecystectomy w/ cholangiography 07/03/11    Dr Jamey Ripa  . Cholecystectomy     Family History  Problem Relation Age of Onset  . Cancer Mother     breast  . Heart disease Father     History  Substance Use Topics  . Smoking status: Never Smoker   . Smokeless tobacco: Not on file  . Alcohol Use: Yes     socially    OB History    Grav Para Term Preterm Abortions TAB SAB Ect Mult Living                  Review of Systems    Cardiovascular: Positive for chest pain.  Gastrointestinal: Positive for vomiting.  All other systems reviewed and are negative.    Allergies  Naprosyn  Home Medications   Current Outpatient Rx  Name Route Sig Dispense Refill  . LEVOTHYROXINE SODIUM 100 MCG PO TABS Oral Take 100 mcg by mouth daily.    Marland Kitchen BIOTIN 5000 MCG PO CAPS Oral Take 5,000 mcg by mouth daily.      Marland Kitchen CETIRIZINE HCL 10 MG PO TABS Oral Take 10 mg by mouth daily.      Marland Kitchen CITALOPRAM HYDROBROMIDE 10 MG PO TABS Oral Take 10 mg by mouth daily.        BP 141/75  Pulse 187  Temp 98.4 F (36.9 C) (Oral)  Resp 19  Ht 5\' 6"  (1.676 m)  Wt 218 lb (98.884 kg)  BMI 35.19 kg/m2  SpO2 98%  Physical Exam  Nursing note and vitals reviewed.  51 year old female who is resting to plantar no acute distress. Vital signs are significant for tachycardia with heart rate of 187, and borderline hypertension with blood pressure 141/75. Oxygen saturation is 90% which is normal. Head is normocephalic and atraumatic. PERRLA, EOMI. Neck is nontender and supple without adenopathy, JVD, or bruit. Back is nontender. Lungs are clear without rales, wheezes, or rhonchi. Heart is tachycardic without  murmur. Abdomen is soft, flat, nontender without masses or hepatomegaly. Extremities have no cyanosis or edema, full range of motion is present. Skin is warm and dry without rash. Neurologic: Mental status is normal, cranial nerves are intact, there are no motor or sensory deficits.  ED Course  Procedures (including critical care time)   Labs Reviewed  CBC WITH DIFFERENTIAL  BASIC METABOLIC PANEL  TROPONIN I   No results found.   Date: 04/03/2012  Rate: 185  Rhythm: atrial fibrillation  QRS Axis: normal  Intervals: normal  ST/T Wave abnormalities: ST depression in the inferior and anterolateral leads which is probably rate related  Conduction Disutrbances:none  Narrative Interpretation: Atrial fibrillation with rapid ventricular response. ST  depression which is most likely rate related. No old ECG available for comparison.  Old EKG Reviewed: none available    1. Atrial fibrillation with rapid ventricular response     CRITICAL CARE Performed by: WUJWJ,XBJYN   Total critical care time: 40 minutes  Critical care time was exclusive of separately billable procedures and treating other patients.  Critical care was necessary to treat or prevent imminent or life-threatening deterioration.  Critical care was time spent personally by me on the following activities: development of treatment plan with patient and/or surrogate as well as nursing, discussions with consultants, evaluation of patient's response to treatment, examination of patient, obtaining history from patient or surrogate, ordering and performing treatments and interventions, ordering and review of laboratory studies, ordering and review of radiographic studies, pulse oximetry and re-evaluation of patient's condition.   MDM  Atrial fibrillation with rapid ventricular response. Since she has no cardiac history, she is a good candidate for elective. She will be given diltiazem bolus and drip to achieve rate control while waiting for her flecainide to convert her. If inadequate rate control is obtained with diltiazem, will also give him metoprolol.   Heart rate is coming down to about 150 with diltiazem. She was given metoprolol and heart rate came down to about 130. Infusion rate on diltiazem will be increased. At this point, it she still has not received her lacunae. Case is endorsed to Dr. Norlene Campbell to watch the patient to see if she converts with the oral floor tonight.     Dione Booze, MD 04/04/12 [phone]

## 2012-04-04 ENCOUNTER — Encounter (HOSPITAL_COMMUNITY): Payer: Self-pay | Admitting: Cardiology

## 2012-04-04 DIAGNOSIS — I4891 Unspecified atrial fibrillation: Secondary | ICD-10-CM

## 2012-04-04 DIAGNOSIS — I059 Rheumatic mitral valve disease, unspecified: Secondary | ICD-10-CM

## 2012-04-04 LAB — HEMOGLOBIN A1C: Mean Plasma Glucose: 123 mg/dL — ABNORMAL HIGH (ref <117)

## 2012-04-04 MED ORDER — HEPARIN BOLUS VIA INFUSION
5000.0000 [IU] | Freq: Once | INTRAVENOUS | Status: AC
  Administered 2012-04-04: 5000 [IU] via INTRAVENOUS
  Filled 2012-04-04: qty 5000

## 2012-04-04 MED ORDER — HEPARIN (PORCINE) IN NACL 100-0.45 UNIT/ML-% IJ SOLN
1200.0000 [IU]/h | INTRAMUSCULAR | Status: DC
  Administered 2012-04-04: 1200 [IU]/h via INTRAVENOUS
  Filled 2012-04-04 (×2): qty 250

## 2012-04-04 MED ORDER — METFORMIN HCL ER 500 MG PO TB24
500.0000 mg | ORAL_TABLET | Freq: Every day | ORAL | Status: DC
  Administered 2012-04-04: 500 mg via ORAL
  Filled 2012-04-04 (×2): qty 1

## 2012-04-04 MED ORDER — DEXTROSE 5 % IV SOLN
5.0000 mg/h | INTRAVENOUS | Status: DC
  Filled 2012-04-04: qty 100

## 2012-04-04 MED ORDER — ASPIRIN EC 325 MG PO TBEC: 325.0000 mg | DELAYED_RELEASE_TABLET | Freq: Every day | ORAL | Status: AC

## 2012-04-04 MED ORDER — CITALOPRAM HYDROBROMIDE 20 MG PO TABS
20.0000 mg | ORAL_TABLET | Freq: Every day | ORAL | Status: DC
  Filled 2012-04-04: qty 1

## 2012-04-04 NOTE — Care Management Note (Signed)
    Page 1 of 1   04/04/2012     9:35:39 AM   CARE MANAGEMENT NOTE 04/04/2012  Patient:  Melinda Carlson, Melinda Carlson   Account Number:  [id number]  Date Initiated:  04/04/2012  Documentation initiated by:  [facility]  Subjective/Objective Assessment:   Afib, RVR - CP.  Spouse     Action/Plan:   Anticipated DC Date:  04/08/2012   Anticipated DC Plan:  HOME/SELF CARE      DC Planning Services  CM consult      Choice offered to / List presented to:             Status of service:  In process, will continue to follow Medicare Important Message given?   (If response is "NO", the following Medicare IM given date fields will be blank) Date Medicare IM given:   Date Additional Medicare IM given:    Discharge Disposition:    Per UR Regulation:  Reviewed for med. necessity/level of care/duration of stay  If discussed at Long Length of Stay Meetings, dates discussed:    Comments:  PCP - Dr. Nathanial Rancher Contact - Spouse - H - [phone], W - [phone], M - [phone]

## 2012-04-04 NOTE — ED Notes (Addendum)
Pt given 20mg  bolus of diltiazem

## 2012-04-04 NOTE — ED Notes (Signed)
Pt given ice chips

## 2012-04-04 NOTE — Progress Notes (Signed)
ANTICOAGULATION CONSULT NOTE - Follow Up Consult  Pharmacy Consult for Heparin Indication: atrial fibrillation  Allergies  Allergen Reactions  . Naprosyn (Naproxen) Swelling    Patient Measurements: Height: 5\' 7"  (170.2 cm) Weight: 224 lb 3.3 oz (101.7 kg) IBW/kg (Calculated) : 61.6  Heparin Dosing Weight: 84Kg  Vital Signs: Temp: 98 F (36.7 C) (06/27 0624) Temp src: Oral (06/27 0624) BP: 94/64 mmHg (06/27 0624) Pulse Rate: 58  (06/27 0624)  Labs:  Basename 04/04/12 1324 04/03/12 2302  HGB -- 13.3  HCT -- 37.7  PLT -- 220  APTT -- --  LABPROT -- --  INR -- --  HEPARINUNFRC 0.55 --  CREATININE -- 0.61  CKTOTAL -- --  CKMB -- --  TROPONINI -- <0.30    Estimated Creatinine Clearance: 104.2 ml/min (by C-G formula based on Cr of 0.61).   Medications:  Prescriptions prior to admission  Medication Sig Dispense Refill  . Biotin 5000 MCG CAPS Take 5,000 mcg by mouth daily.        . cetirizine (ZYRTEC) 10 MG tablet Take 10 mg by mouth at bedtime.       . citalopram (CELEXA) 10 MG tablet Take 20 mg by mouth at bedtime.       Marland Kitchen levothyroxine (SYNTHROID, LEVOTHROID) 25 MCG tablet Take 25 mcg by mouth daily.      . metFORMIN (GLUCOPHAGE-XR) 500 MG 24 hr tablet Take 500 mg by mouth daily with breakfast.      . OVER THE COUNTER MEDICATION Take 2 tablets by mouth 2 (two) times daily. "triphala"        Assessment: Patient was continues on IV heparin started for afib. Notably she was on low dose syntrhoid PTA for throid nodules, but no formal diagnosis of hypothyroidism. Patient has since converted to NSR. Noted heparin level is therapeutic, H/H & plts are stable and no bleeding is reported.  Noted possible plans to switch to ASA 325mg  for maintenance. Noted normal TSH, home synthroid not resumed.  Goal of Therapy:  Heparin level 0.3-0.7 units/ml Monitor platelets by anticoagulation protocol: Yes   Plan:  - Continue on Heparin at 1200 units/hr - Recheck level at 2000  today to confirm remains therapeutic per protocol - Will f/up Daily heparin level and CBC - Will f/up plan for Synthroid  Aleza Pew K 04/04/2012,2:43 PM

## 2012-04-04 NOTE — ED Notes (Signed)
Pt's given 10mg  bolus of diltiazem

## 2012-04-04 NOTE — ED Notes (Signed)
Report given to floor. Pt transported to the floor. Pt alert and oriented in NAD distress at time of admission

## 2012-04-04 NOTE — Progress Notes (Signed)
  Echocardiogram 2D Echocardiogram has been performed.  Melinda Carlson 04/04/2012, 3:59 PM

## 2012-04-04 NOTE — H&P (Signed)
CARDIOLOGY ADMISSION NOTE  Patient ID: Melinda Carlson MRN: [id number] DOB/AGE: 1961-09-23 51 y.o.  Admit date: 04/03/2012 Primary Physician   Dr. Nathanial Rancher Primary Cardiologist   None Chief Complaint    Palpitations  HPI:  The patient has no prior cardiac history.  She has been on Synthroid recently for thyroid nodules.  She does not report a history of hypothyroidism.  Today she felt somewhat nauseated.  At 8 PM she felt her heart beating fast in her chest .  She felt some dizziness.  She was anxious.  Her son took her to see some EMTs and she was noted to be in atrial fibrillation.  At [facility] she was treated with IV Diltiazem, metoprolol and PO flecainide.  Her rate is slower but she is still in atrial fibrillation.  She otherwise has felt well.  She is active.  The patient denies any new symptoms such as chest discomfort, neck or arm discomfort. There has been no new shortness of breath, PND or orthopnea. There have been no reported palpitations, presyncope or syncope.  Past Medical History  Diagnosis Date  . History of uterine cancer   . Family history of breast cancer     mother  . Gallstones 02/28/2011  . Multiple thyroid nodules   . Diabetes mellitus     Prediabetes    Past Surgical History  Procedure Date  . Tonsillectomy   . Abdominal hysterectomy 02/28/11  . Laparoscopic cholecystectomy w/ cholangiography 07/03/11    Dr Jamey Ripa    Allergies  Allergen Reactions  . Naprosyn (Naproxen) Swelling   No current facility-administered medications on file prior to encounter.   Current Outpatient Prescriptions on File Prior to Encounter  Medication Sig Dispense Refill  . Biotin 5000 MCG CAPS Take 5,000 mcg by mouth daily.        . cetirizine (ZYRTEC) 10 MG tablet Take 10 mg by mouth at bedtime.       . citalopram (CELEXA) 10 MG tablet Take 20 mg by mouth at bedtime.        synthroid Take 25 mcg by mouth daily    . metFORMIN (GLUCOPHAGE-XR) 500 MG 24 hr tablet Take 500 mg by  mouth daily with breakfast.       History   Social History  . Marital Status: Married    Spouse Name: N/A    Number of Children: 3  . Years of Education: N/A   Occupational History  . [employer]    Social History Main Topics  . Smoking status: Never Smoker   . Smokeless tobacco: Not on file  . Alcohol Use: Yes     socially  . Drug Use: Not on file  . Sexually Active: Not on file   Other Topics Concern  . Not on file   Social History Narrative   Lives at home with husband and 3 sons.    Family History  Problem Relation Age of Onset  . Cancer Mother     Breast  . Heart disease Father 41    CABG, Died age 75    ROS:  Weight gain 20 lbs one year, hot natured, diffuse joint pains.  As stated in the HPI and negative for all other systems.  Physical Exam: Blood pressure 143/87, pulse 105, temperature 98.4 F (36.9 C), temperature source Oral, resp. rate 17, height 5\' 6"  (1.676 m), weight 218 lb (98.884 kg), SpO2 100.00%.  GENERAL:  Well appearing HEENT:  Pupils equal round and reactive, fundi not  visualized, oral mucosa unremarkable NECK:  No jugular venous distention, waveform within normal limits, carotid upstroke brisk and symmetric, no bruits, no thyromegaly LYMPHATICS:  No cervical, inguinal adenopathy LUNGS:  Clear to auscultation bilaterally BACK:  No CVA tenderness CHEST:  Unremarkable HEART:  PMI not displaced or sustained,S1 and S2 within normal limits, no S3, no clicks, no rubs, no murmurs, irregular ABD:  Flat, positive bowel sounds normal in frequency in pitch, no bruits, no rebound, no guarding, no midline pulsatile mass, no hepatomegaly, no splenomegaly EXT:  2 plus pulses throughout, no edema, no cyanosis no clubbing SKIN:  No rashes no nodules NEURO:  Cranial nerves II through XII grossly intact, motor grossly intact throughout PSYCH:  Cognitively intact, oriented to person place and time  Labs: Lab Results  Component Value Date   BUN 10  04/03/2012   Lab Results  Component Value Date   CREATININE 0.61 04/03/2012   Lab Results  Component Value Date   NA 138 04/03/2012   K 4.0 04/03/2012   CL 103 04/03/2012   CO2 22 04/03/2012   Lab Results  Component Value Date   TROPONINI <0.30 04/03/2012   Lab Results  Component Value Date   WBC 10.9* 04/03/2012   HGB 13.3 04/03/2012   HCT 37.7 04/03/2012   MCV 87.7 04/03/2012   PLT 220 04/03/2012    EKG:  Atrial fibrillation with a rapid ventricular rate.  Axis WNL.  No acute ST T wave changes.  04/04/2012  ASSESSMENT AND PLAN:    Atrial fibrillation:  I will admit her.  I will start heparin.  I will continue IV diltiazem.  I will keep NPO.  If she does not convert she will need DCCV.  I will check a TSH and hold synthroid for now.  Echocardiogram will be ordered but could be done as an outpatient if she converts.  Thyroid nodules:  She is on low dose synthroid but tells me that she does not have hypothryoidism but that this was started because of thyroid nodules.  Diabetes:  The patient reports "prediabetes".  I will check an A1C.  She will remain on previous medications.    SignedRollene Rotunda 04/04/2012, 3:53 AM

## 2012-04-04 NOTE — ED Notes (Signed)
Pt up to bedside toilet with [facility]

## 2012-04-04 NOTE — ED Notes (Signed)
Cards at bedside

## 2012-04-04 NOTE — Progress Notes (Signed)
ANTICOAGULATION CONSULT NOTE - Initial Consult  Pharmacy Consult for heparin Indication: atrial fibrillation  Allergies  Allergen Reactions  . Naprosyn (Naproxen) Swelling    Patient Measurements: Height: 5\' 6"  (167.6 cm) Weight: 218 lb (98.884 kg) IBW/kg (Calculated) : 59.3  Heparin Dosing Weight: 84kg  Vital Signs: Temp: 98.4 F (36.9 C) (06/26 2253) Temp src: Oral (06/26 2253) BP: 107/70 mmHg (06/27 0500) Pulse Rate: 58  (06/27 0500)  Labs:  Basename 04/03/12 2302  HGB 13.3  HCT 37.7  PLT 220  APTT --  LABPROT --  INR --  HEPARINUNFRC --  CREATININE 0.61  CKTOTAL --  CKMB --  TROPONINI <0.30    Estimated Creatinine Clearance: 100.9 ml/min (by C-G formula based on Cr of 0.61).   Medical History: Past Medical History  Diagnosis Date  . History of uterine cancer   . Family history of breast cancer     mother  . Gallstones 02/28/2011  . Multiple thyroid nodules   . Diabetes mellitus     Prediabetes    Medications:  Prescriptions prior to admission  Medication Sig Dispense Refill  . Biotin 5000 MCG CAPS Take 5,000 mcg by mouth daily.        . cetirizine (ZYRTEC) 10 MG tablet Take 10 mg by mouth at bedtime.       . citalopram (CELEXA) 10 MG tablet Take 20 mg by mouth at bedtime.       Marland Kitchen levothyroxine (SYNTHROID, LEVOTHROID) 25 MCG tablet Take 25 mcg by mouth daily.      . metFORMIN (GLUCOPHAGE-XR) 500 MG 24 hr tablet Take 500 mg by mouth daily with breakfast.      . OVER THE COUNTER MEDICATION Take 2 tablets by mouth 2 (two) times daily. "triphala"       Scheduled:    . citalopram  20 mg Oral QHS  . diltiazem (CARDIZEM) infusion  5-15 mg/hr Intravenous Once  . flecainide  300 mg Oral Once  . heparin  5,000 Units Intravenous Once  . metFORMIN  500 mg Oral Q breakfast  . metoprolol  5 mg Intravenous Once  . ondansetron (ZOFRAN) IV  4 mg Intravenous Once    Assessment: 51yo female c/o racing heart, EMT found pt to be in Afib, started on dilt gtt in  ED, now to begin heparin.  Goal of Therapy:  Heparin level 0.3-0.7 units/ml Monitor platelets by anticoagulation protocol: Yes   Plan:  Will give heparin bolus of 5000 units x1 followed by gtt at 1200 units/hr and monitor heparin levels and CBC.  Colleen Can PharmD BCPS 04/04/2012,5:24 AM

## 2012-04-04 NOTE — Discharge Summary (Signed)
Discharge Summary   Patient ID: Melinda Carlson MRN: [id number], DOB/AGE: [birth year] 49 y.o.  Primary MD: Ailene Ravel, MD Primary Cardiologist: Dr. Antoine Poche Admit date: 04/03/2012 D/C date:     04/04/2012      Primary Discharge Diagnoses:  1. Atrial Fibrillation w/ RVR  - newly diagnosed this admission  - Converted to NSR after diltiazem, metoprolol and flecainide administration  - CHA2DS2VASc score 1, place on 325mg  ASA   - Echo w/o significant valvular abnls, no WMAs, EF 55-60%  Secondary Discharge Diagnoses:  1. Prediabetes - A1C 5.9 2. Multiple thyroid nodules  3. Gallstones s/p Laparoscopic cholecystectomy w/ cholangiography 2012 4. History of uterine cancer s/p Abdominal hysterectomy 2012 5. Family history of breast cancer - mother   Allergies Allergies  Allergen Reactions  . Naprosyn (Naproxen) Swelling    Diagnostic Studies/Procedures:   04/04/12 - 2D Echocardiogram Study Conclusions: - Left ventricle: The cavity size was normal. Wall thickness was increased in a pattern of mild LVH. Systolic function was normal. The estimated ejection fraction was in the range of 55% to 60%. Wall motion was normal; there were no regional wall motion abnormalities. - Mitral valve: Mild regurgitation.   History of Present Illness: 51 y.o. female w/ the above medical problems who presented to [facility] on 04/04/12 with new onset atrial fibrillation.  She has no prior cardiac history. Was diagnosed with thyroid nodules and placed on synthroid recently. On day of presentation she felt nauseated and later felt her heart racing with associated dizziness and anxiousness. Her son took her to see an EMT who found her to be in atrial fibrillation for which she was taken to [facility] ED.  Hospital Course: EKG revealed atrial fibrillation 185bpm, no acute ST/T changes. She was give IV diltiazem, metoprolol and oral flecainide, but remained in a.fib. Labs were significant for  normal troponin. She was placed on IV heparin, continued on IV diltiazem and admitted for further evaluation and treatment with plans for DCCV if she did not spontaneously convert.   She became bradycardic with rates in the 50s for which cardizem was stopped. She spontaneously converted to sinus rhythm. TSH was checked and found normal. Echo revealed nl LV systolic fxn, EF 65-78%, no WMAs, mild MR. With a CHA2DS2VASc score 1 (Female) will be discharged on 325mg  ASA. She was seen and evaluated by Dr. Tenny Craw who felt she was stable for discharge home with plans for follow up as scheduled below.  Discharge Vitals: Blood pressure 94/64, pulse 58, temperature 98 F (36.7 C), temperature source Oral, resp. rate 16, height 5\' 7"  (1.702 m), weight 224 lb 3.3 oz (101.7 kg), SpO2 97.00%.  Labs: Component Value Date   WBC 10.9* 04/03/2012   HGB 13.3 04/03/2012   HCT 37.7 04/03/2012   MCV 87.7 04/03/2012   PLT 220 04/03/2012    Lab 04/03/12 2302  NA 138  K 4.0  CL 103  CO2 22  BUN 10  CREATININE 0.61  CALCIUM 9.2  GLUCOSE 130*   Basename 04/03/12 2302  TROPONINI <0.30     04/04/2012 06:30  Hemoglobin A1C 5.9 (H)     04/04/2012 06:30  TSH 1.406    Discharge Medications   Medication List  As of 04/04/2012  4:58 PM   TAKE these medications         aspirin EC 325 MG tablet   Take 1 tablet (325 mg total) by mouth daily.      Biotin 5000 MCG Caps  Take 5,000 mcg by mouth daily.      cetirizine 10 MG tablet   Commonly known as: ZYRTEC   Take 10 mg by mouth at bedtime.      citalopram 10 MG tablet   Commonly known as: CELEXA   Take 20 mg by mouth at bedtime.      levothyroxine 25 MCG tablet   Commonly known as: SYNTHROID, LEVOTHROID   Take 25 mcg by mouth daily.      metFORMIN 500 MG 24 hr tablet   Commonly known as: GLUCOPHAGE-XR   Take 500 mg by mouth daily with breakfast.      OVER THE COUNTER MEDICATION   Take 2 tablets by mouth 2 (two) times daily. "triphala"             Disposition   Discharge Orders    Future Appointments: Provider: Department: Dept Phone: Center:   04/23/2012 11:40 AM Beatrice Lecher, PA [facility] [place] [phone] [facility]   04/25/2012 8:30 AM [name], MD PHD [facility] [phone] None   08/12/2012 8:00 AM [facility] [place] [facility] [phone] [facility]     Future Orders Please Complete By Expires   Diet - low sodium heart healthy      Discharge instructions      Comments:   **PLEASE REMEMBER TO BRING ALL OF YOUR MEDICATIONS TO EACH OF YOUR FOLLOW-UP OFFICE VISITS.   Activity as tolerated - No restrictions        Follow-up Information    Follow up with Tereso Newcomer, PA on 04/23/2012. (11:40)    Contact information:   [facility] [address] [place] [address] [phone]           Outstanding Labs/Studies:  None  Duration of Discharge Encounter: Greater than 30 minutes including physician and PA time.  Signed, Johnwesley Lederman PA-C 04/04/2012, 4:58 PM

## 2012-04-04 NOTE — ED Provider Notes (Signed)
Pt with persistent afib with RVR despite cardiazem drip at 10, to have bolus and increase drip to 15/hour.  Has had flecanide for about an hour.  Pt with some chest pressure, sob.  BP low but stable.  Discussed with patient admission for further control of afib.  Pt does not have cardiologist, no prior h/o same.  Pt sees MD in [facility]  Olivia Mackie, MD 04/04/12 [phone]

## 2012-04-04 NOTE — Progress Notes (Signed)
Subjective: Patient feeling better.  No Chest tightness or SOB Objective: Filed Vitals:   04/04/12 0340 04/04/12 0415 04/04/12 0500 04/04/12 0624  BP: 111/73 114/62 107/70 94/64  Pulse: 115 75 58 58  Temp:    98 F (36.7 C)  TempSrc:    Oral  Resp: 20 15 17 16   Height:   5\' 7"  (1.702 m)   Weight:   224 lb 3.3 oz (101.7 kg)   SpO2: 96% 97% 95% 97%   Weight change:  No intake or output data in the 24 hours ending 04/04/12 1015  General: Alert, awake, oriented x3, in no acute distress Neck:  JVP is normal Heart: Regular rate and rhythm, without murmurs, rubs, gallops.  Lungs: Clear to auscultation.  No rales or wheezes. Exemities:  No edema.   Neuro: Grossly intact, nonfocal.  Tele:  Sr Lab Results: Results for orders placed during the hospital encounter of 04/03/12 (from the past 24 hour(s))  CBC WITH DIFFERENTIAL     Status: Abnormal   Collection Time   04/03/12 11:02 PM      Component Value Range   WBC 10.9 (*) 4.0 - 10.5 K/uL   RBC 4.30  3.87 - 5.11 MIL/uL   Hemoglobin 13.3  12.0 - 15.0 g/dL   HCT 53.6  64.4 - 03.4 %   MCV 87.7  78.0 - 100.0 fL   MCH 30.9  26.0 - 34.0 pg   MCHC 35.3  30.0 - 36.0 g/dL   RDW 74.2  59.5 - 63.8 %   Platelets 220  150 - 400 K/uL   Neutrophils Relative 71  43 - 77 %   Neutro Abs 7.7  1.7 - 7.7 K/uL   Lymphocytes Relative 21  12 - 46 %   Lymphs Abs 2.3  0.7 - 4.0 K/uL   Monocytes Relative 5  3 - 12 %   Monocytes Absolute 0.6  0.1 - 1.0 K/uL   Eosinophils Relative 3  0 - 5 %   Eosinophils Absolute 0.3  0.0 - 0.7 K/uL   Basophils Relative 0  0 - 1 %   Basophils Absolute 0.0  0.0 - 0.1 K/uL  BASIC METABOLIC PANEL     Status: Abnormal   Collection Time   04/03/12 11:02 PM      Component Value Range   Sodium 138  135 - 145 mEq/L   Potassium 4.0  3.5 - 5.1 mEq/L   Chloride 103  96 - 112 mEq/L   CO2 22  19 - 32 mEq/L   Glucose, Bld 130 (*) 70 - 99 mg/dL   BUN 10  6 - 23 mg/dL   Creatinine, Ser 7.56  0.50 - 1.10 mg/dL   Calcium 9.2  8.4  - 43.3 mg/dL   GFR calc non Af Amer >90  >90 mL/min   GFR calc Af Amer >90  >90 mL/min  TROPONIN I     Status: Normal   Collection Time   04/03/12 11:02 PM      Component Value Range   Troponin I <0.30  <0.30 ng/mL    Studies/Results: No results found.  Medications: reviewed.   1.  Atrial fibrillation Patient converted to SR   Feeling better.  On heparin.  Other meds held.  Will get echo  Await TSH.  If normal could conceivably go home on ASA 325 since it appears to be very short lived episode of afib.  2.  Thyroid  Awaiting results.  LOS: 1 day  Dietrich Pates 04/04/2012, 10:15 AM

## 2012-04-04 NOTE — ED Notes (Signed)
Pt's heart rate found to be 56, pt's diltiazem drip stopped and ekg recorded

## 2012-04-23 ENCOUNTER — Encounter: Payer: BC Managed Care – PPO | Admitting: Physician Assistant

## 2012-04-25 ENCOUNTER — Ambulatory Visit: Payer: BC Managed Care – PPO | Attending: Gynecologic Oncology | Admitting: Gynecologic Oncology

## 2012-04-25 ENCOUNTER — Encounter: Payer: Self-pay | Admitting: Gynecologic Oncology

## 2012-04-25 ENCOUNTER — Encounter: Payer: BC Managed Care – PPO | Admitting: Physician Assistant

## 2012-04-25 VITALS — BP 120/72 | HR 70 | Temp 97.9°F | Resp 20 | Ht 67.75 in | Wt 230.4 lb

## 2012-04-25 DIAGNOSIS — Z803 Family history of malignant neoplasm of breast: Secondary | ICD-10-CM | POA: Insufficient documentation

## 2012-04-25 DIAGNOSIS — Z90710 Acquired absence of both cervix and uterus: Secondary | ICD-10-CM | POA: Insufficient documentation

## 2012-04-25 DIAGNOSIS — C549 Malignant neoplasm of corpus uteri, unspecified: Secondary | ICD-10-CM | POA: Insufficient documentation

## 2012-04-25 DIAGNOSIS — C541 Malignant neoplasm of endometrium: Secondary | ICD-10-CM

## 2012-04-25 DIAGNOSIS — Z79899 Other long term (current) drug therapy: Secondary | ICD-10-CM | POA: Insufficient documentation

## 2012-04-25 DIAGNOSIS — Z9079 Acquired absence of other genital organ(s): Secondary | ICD-10-CM | POA: Insufficient documentation

## 2012-04-25 NOTE — Progress Notes (Signed)
Office Visit  Note: Gyn-Onc   Melinda Carlson 51 y.o. female  CC:  Chief Complaint  Patient presents with  . Endo ca    Follow up    HPI: This is a 51 year old who underwent staging in April 2012 which was inclusive of a total abdominal hysterectomy bilateral salpingo-oophorectomy bilateral pelvic and periaortic lymph node dissection. Final pathology was consistent with a stage IA grade 1 endometrioid adenocarcinoma. The tumor involves endocervical glands without any endometrial stromal invasion. No lymphovascular space involvement or other high-risk features were noted. The tumor involved the myometrium to a depth of 5 mm where the myometrium was 13 mm.  Interval History: The patient denies any vaginal bleeding. She states that her hot flashes are horrible. She's currently on Celexa without significant improvement. She states that they occur approximately 4 times a day not associated with insomnia. She reports weight gain denies abdominal pain hematuria or hematochezia.   Allergy:  Allergies  Allergen Reactions  . Naprosyn (Naproxen) Swelling    Social Hx:   History   Social History  . Marital Status: Married    Spouse Name: N/A    Number of Children: 3  . Years of Education: N/A   Occupational History  . [employer]    Social History Main Topics  . Smoking status: Never Smoker   . Smokeless tobacco: Not on file  . Alcohol Use: Yes     socially  . Drug Use: Not on file  . Sexually Active: Not on file   Other Topics Concern  . Not on file   Social History Narrative   Lives at home with husband and 3 sons.    Past Surgical Hx:  Past Surgical History  Procedure Date  . Tonsillectomy   . Abdominal hysterectomy 02/28/11  . Laparoscopic cholecystectomy w/ cholangiography 07/03/11    Dr Jamey Ripa    Past Medical Hx:  Past Medical History  Diagnosis Date  . History of uterine cancer     s/p hysterectomy  . Family history of breast cancer     mother  .  Gallstones 02/28/2011     s/p choley  . Multiple thyroid nodules     TSH nl 03/2012  . Prediabetes     A1C 5.9 03/2012  . Atrial fibrillation 03/2012    spontaneously converted to NSR; CHA2DS2VASc score 1 (Female)  Colonoscopy in November.  Negative  Family Hx:  Family History  Problem Relation Age of Onset  . Cancer Mother     Breast  . Heart disease Father 25    CABG, Died age 48    Review of Systems:  Constitutional  Feels well reports weight gain Cardiovascular  No chest pain, shortness of breath, or edema nausea withafib Pulmonary  No cough or wheeze.  Gastro Intestinal No nausea, vomitting, or diarrhoea. Occasional  bright red blood per rectum, no abdominal pain, change in bowel movement, or constipation. Occasional pain with defecation. Genito Urinary  No frequency, urgency, dysuria, incontinence with sneeze or cough Musculo Skeletal  Myalgia with levothyroxine, no arthralgia, joint swelling or pain  Neurologic  No weakness, numbness, change in gait. Vasomotor instability 4 times/day, worse with stress.  Denies insomnia Psychology  No depression, anxiety, insomnia.     Vitals:  Blood pressure 120/72, pulse 70, temperature 97.9 F (36.6 C), temperature source Oral, resp. rate 20, height 5' 7.75" (1.721 m), weight 230 lb 6.4 oz (104.509 kg).  Physical Exam: WD female in NAD Neck  Supple without  any enlargements.  Lymph node survey. No cervical supraclavicular cervical or inguinal adenopathy Cardiovascular  Pulse normal rate, regularity and rhythm. S1 and S2 normal. Lungs  Clear to auscultation bilateraly, without wheezes/crackles/rhonchi. Good air movement.  Psychiatry  Alert and oriented to person, place, and time  Back No CVA tenderness Abdomen  Normoactive bowel sounds, abdomen soft, non-tender and obese. Surgical  sites intact without evidence of hernia.  Genito Urinary   Vulva/vagina: Normal external female genitalia.  No lesions.    Bladder/urethra:   No lesions or masses, atrophic, no lesions.  Vagina:Vagina atrophic, no masses Rectal  Good tone.  No external hemmorhoids. No masses Good tone, no masses no cul de sac nodularity.  Extremities  No bilateral cyanosis, clubbing or edema.    Assessment/Plan:  This is a 51 y.o. year old with Stage 1A endometrial cancer.  NED.  F/U in 6 months with Dr. Juliene Pina with Pap test F/U in 12 months with Gyn Onc Counseled re signs and symptoms of recurrence Advised to f/u with GI if rectal discomfort and spotting continues  [name], MD., PhD. 04/25/2012, 8:23 AM

## 2012-04-25 NOTE — Patient Instructions (Addendum)
F/U in 6 months with Dr. Juliene Pina with Pap test F/U in 12 months with Gyn Onc Counseled re signs and symptoms of recurrence  Thank you very much Ms. Melinda Carlson for allowing me to provide care for you today.  I appreciate your confidence in choosing our Gynecologic Oncology team.  If you have any questions about your visit today please call our office and we will get back to you as soon as possible.  Melinda Carlson. Melinda Hawbaker MD., PhD Gynecologic Oncology

## 2012-05-02 ENCOUNTER — Encounter: Payer: Self-pay | Admitting: Physician Assistant

## 2012-05-02 ENCOUNTER — Ambulatory Visit (INDEPENDENT_AMBULATORY_CARE_PROVIDER_SITE_OTHER): Payer: BC Managed Care – PPO | Admitting: Physician Assistant

## 2012-05-02 VITALS — BP 128/86 | HR 65 | Ht 68.0 in | Wt 227.1 lb

## 2012-05-02 DIAGNOSIS — I4891 Unspecified atrial fibrillation: Secondary | ICD-10-CM | POA: Insufficient documentation

## 2012-05-02 NOTE — Progress Notes (Signed)
[address] [place], [place]  [address] Phone: [phone] Fax:  [phone]  Date:  05/02/2012   [name]   DOB:  November 13, 1960   MRN:  [id number]  PCP:  Ailene Ravel, MD  Primary Cardiologist:  Dr. Rollene Rotunda  Primary Electrophysiologist:  None    History of Present Illness: Melinda Carlson is a 51 y.o. female who returns for post hospital follow up.  She was recently diagnosed with thyroid nodules and was placed on Synthroid.  She had no prior cardiac hx.    She was admitted 6/26-6/27 with paroxysmal atrial fibrillation with RVR.  She presented with palpitations and nausea, dizziness and anxiousness.  She was treated in the emergency room with IV diltiazem, metoprolol and oral flecainide.  She was placed on IV heparin with plans to cardiovert.  However, she converted to sinus rhythm.  Echo 04/04/12: Mild LVH, EF 55-60%, mild MR.  She has a low thromboembolic risk factor profile.  She was placed on aspirin only.  CHA2DS2-VASc=1 (female).  Doing well since d/c.   The patient denies chest pain, shortness of breath, syncope, orthopnea, PND or significant pedal edema.  No palpitations.     TSH  Date/Time Value Range Status  04/04/2012  6:30 AM 1.406  0.350 - 4.500 uIU/mL Final    Past Medical History  Diagnosis Date  . History of uterine cancer     s/p hysterectomy  . Family history of breast cancer     mother  . Gallstones 02/28/2011     s/p choley  . Multiple thyroid nodules     TSH nl 03/2012  . Prediabetes     A1C 5.9 03/2012  . Atrial fibrillation 03/2012    spontaneously converted to NSR; CHA2DS2VASc score 1 (Female)    Current Outpatient Prescriptions  Medication Sig Dispense Refill  . aspirin 325 MG tablet Take 325 mg by mouth daily.      . citalopram (CELEXA) 40 MG tablet Take 40 mg by mouth daily.      Marland Kitchen levothyroxine (SYNTHROID, LEVOTHROID) 25 MCG tablet Take 25 mcg by mouth daily.      . metFORMIN (GLUCOPHAGE-XR) 500  MG 24 hr tablet Take 500 mg by mouth daily with breakfast.      . Biotin 5000 MCG CAPS Take 5,000 mcg by mouth daily.        . calcium-vitamin D (OSCAL WITH D) 250-125 MG-UNIT per tablet Take 1 tablet by mouth daily.      . cetirizine (ZYRTEC) 10 MG tablet Take 10 mg by mouth at bedtime.       Marland Kitchen GARCINIA CAMBOGIA-CHROMIUM PO Take 1 tablet by mouth 2 (two) times daily.      Marland Kitchen OVER THE COUNTER MEDICATION Take 2 tablets by mouth 2 (two) times daily. "triphala"        Allergies: Allergies  Allergen Reactions  . Naprosyn (Naproxen) Swelling    History  Substance Use Topics  . Smoking status: Never Smoker   . Smokeless tobacco: Not on file  . Alcohol Use: Yes     socially     ROS:  Please see the history of present illness.    She denies a hx of snoring, daytime hypersomnolence.     PHYSICAL EXAM: VS:  BP 128/86  Pulse 65  Ht 5\' 8"  (1.727 m)  Wt 227 lb 1.9 oz (103.021 kg)  BMI 34.53 kg/m2 Well nourished, well developed, in no acute distress HEENT: normal  Neck: no JVD Cardiac:  normal S1, S2; RRR; no murmur Lungs:  clear to auscultation bilaterally, no wheezing, rhonchi or rales Abd: soft, nontender, no hepatomegaly Ext: no edema Skin: warm and dry Neuro:  CNs 2-12 intact, no focal abnormalities noted  EKG:  Sinus rhythm, heart rate 65, normal axis      ASSESSMENT AND PLAN:  1.  Paroxysmal atrial fibrillation She essentially has had lone AFib. CHA2DS2-VASc score is 1. ASA therapy only is acceptable. Her echo was normal.   Recommend watchful waiting approach for now. If she has recurrent AFib, she may be a good candidate for a "pill in the pocket" approach. Follow up with Dr. Rollene Rotunda in 3 mos.   Luna Glasgow, PA-C  9:21 AM 05/02/2012

## 2012-05-02 NOTE — Patient Instructions (Addendum)
Your physician recommends that you schedule a follow-up appointment in: 3 MONTHS WITH DR. HOCHREIN  NO CHANGES WERE MADE TODAY   

## 2012-06-13 ENCOUNTER — Ambulatory Visit: Payer: BC Managed Care – PPO | Admitting: Cardiology

## 2012-07-16 ENCOUNTER — Ambulatory Visit: Payer: BC Managed Care – PPO | Admitting: Cardiology

## 2012-07-22 ENCOUNTER — Ambulatory Visit: Payer: BC Managed Care – PPO | Admitting: Physician Assistant

## 2012-08-12 ENCOUNTER — Other Ambulatory Visit: Payer: BC Managed Care – PPO

## 2012-08-12 ENCOUNTER — Ambulatory Visit
Admission: RE | Admit: 2012-08-12 | Discharge: 2012-08-12 | Disposition: A | Discharge status: Home / Self Care | Payer: BC Managed Care – PPO | Source: Ambulatory Visit | Attending: Endocrinology | Admitting: Endocrinology

## 2012-08-12 DIAGNOSIS — E049 Nontoxic goiter, unspecified: Secondary | ICD-10-CM

## 2012-08-22 ENCOUNTER — Ambulatory Visit: Payer: BC Managed Care – PPO | Admitting: Cardiology

## 2012-08-26 ENCOUNTER — Ambulatory Visit (INDEPENDENT_AMBULATORY_CARE_PROVIDER_SITE_OTHER): Payer: BC Managed Care – PPO | Admitting: Cardiology

## 2012-08-26 ENCOUNTER — Encounter: Payer: Self-pay | Admitting: Cardiology

## 2012-08-26 VITALS — BP 142/90 | HR 72 | Ht 67.0 in | Wt 234.0 lb

## 2012-08-26 DIAGNOSIS — I4891 Unspecified atrial fibrillation: Secondary | ICD-10-CM

## 2012-08-26 NOTE — Progress Notes (Signed)
   HPI The patient presents for follow up of atrial fib.  Since I last saw her she has had no recurrence of this. She has had no palpitations, presyncope or syncope. She has had no chest pressure, neck or arm discomfort. She has had no shortness of breath, PND or orthopnea.   Allergies  Allergen Reactions  . Naprosyn (Naproxen) Swelling    Current Outpatient Prescriptions  Medication Sig Dispense Refill  . citalopram (CELEXA) 40 MG tablet Take 40 mg by mouth daily.      Marland Kitchen levothyroxine (SYNTHROID, LEVOTHROID) 25 MCG tablet Take 25 mcg by mouth daily.      . metFORMIN (GLUCOPHAGE-XR) 500 MG 24 hr tablet Take 500 mg by mouth daily with breakfast.        Past Medical History  Diagnosis Date  . History of uterine cancer     s/p hysterectomy  . Family history of breast cancer     mother  . Gallstones 02/28/2011     s/p choley  . Multiple thyroid nodules     TSH nl 03/2012  . Prediabetes     A1C 5.9 03/2012  . Atrial fibrillation 03/2012    spontaneously converted to NSR; CHA2DS2VASc score 1 (Female)    Past Surgical History  Procedure Date  . Tonsillectomy   . Abdominal hysterectomy 02/28/11  . Laparoscopic cholecystectomy w/ cholangiography 07/03/11    Dr Jamey Ripa    ROS:  As stated in the HPI and negative for all other systems.  PHYSICAL EXAM BP 142/90  Pulse 72  Ht 5\' 7"  (1.702 m)  Wt 234 lb (106.142 kg)  BMI 36.65 kg/m2 GENERAL:  Well appearing HEENT:  Pupils equal round and reactive, fundi not visualized, oral mucosa unremarkable NECK:  No jugular venous distention, waveform within normal limits, carotid upstroke brisk and symmetric, no bruits, no thyromegaly LYMPHATICS:  No cervical, inguinal adenopathy LUNGS:  Clear to auscultation bilaterally BACK:  No CVA tenderness CHEST:  Unremarkable HEART:  PMI not displaced or sustained,S1 and S2 within normal limits, no S3, no S4, no clicks, no rubs, no murmurs ABD:  Flat, positive bowel sounds normal in frequency in pitch,  no bruits, no rebound, no guarding, no midline pulsatile mass, no hepatomegaly, no splenomegaly EXT:  2 plus pulses throughout, no edema, no cyanosis no clubbing SKIN:  No rashes no nodules NEURO:  Cranial nerves II through XII grossly intact, motor grossly intact throughout PSYCH:  Cognitively intact, oriented to person place and time  EKG:  Sinus rhythm, rate 72, axis within normal limits, intervals within normal limits, no acute ST-T wave changes.  ASSESSMENT AND PLAN  Atrial fibrillation No further therapy is indicated. She has not seen any aspirin. She would be a good candidate for a flecainide "pill in pocket" approach should she have atrial fibrillation again. We discussed this.

## 2012-08-26 NOTE — Patient Instructions (Addendum)
The current medical regimen is effective;  continue present plan and medications.  Follow up as needed 

## 2012-11-07 ENCOUNTER — Other Ambulatory Visit: Payer: Self-pay | Admitting: Endocrinology

## 2012-11-07 DIAGNOSIS — E049 Nontoxic goiter, unspecified: Secondary | ICD-10-CM

## 2013-02-19 ENCOUNTER — Other Ambulatory Visit: Payer: BC Managed Care – PPO

## 2013-02-19 ENCOUNTER — Ambulatory Visit
Admission: RE | Admit: 2013-02-19 | Discharge: 2013-02-19 | Disposition: A | Discharge status: Home / Self Care | Payer: BC Managed Care – PPO | Source: Ambulatory Visit | Attending: Endocrinology | Admitting: Endocrinology

## 2013-02-19 DIAGNOSIS — E049 Nontoxic goiter, unspecified: Secondary | ICD-10-CM

## 2013-02-26 ENCOUNTER — Other Ambulatory Visit: Payer: Self-pay | Admitting: Endocrinology

## 2013-02-26 DIAGNOSIS — E049 Nontoxic goiter, unspecified: Secondary | ICD-10-CM

## 2013-04-23 ENCOUNTER — Telehealth: Payer: Self-pay | Admitting: Gynecologic Oncology

## 2013-04-23 NOTE — Telephone Encounter (Signed)
Office Visit  Note: Gyn-Onc   ZAILYNN BRANDEL 52 y.o. female  CC:  Endometrial cancer surveillance   Assessment/Plan:  This is a 52 y.o. with Stage 1A endometrial cancer.  NED.  F/U in 6 months with Dr. Juliene Pina with Pap test F/U in 12 months with Gyn Onc Counseled re signs and symptoms of recurrence   HPI: This is a 52 year old who underwent staging in April 2012 which was inclusive of a total abdominal hysterectomy bilateral salpingo-oophorectomy bilateral pelvic and periaortic lymph node dissection. Final pathology was consistent with a stage IA grade 1 endometrioid adenocarcinoma. The tumor involves endocervical glands without any endometrial stromal invasion. No lymphovascular space involvement or other high-risk features were noted. The tumor involved the myometrium to a depth of 5 mm where the myometrium was 13 mm.  Allergy:  Allergies  Allergen Reactions  . Naprosyn (Naproxen) Swelling    Social Hx:   History   Social History  . Marital Status: Married    Spouse Name: N/A    Number of Children: 3  . Years of Education: N/A   Occupational History  . [employer]    Social History Main Topics  . Smoking status: Never Smoker   . Smokeless tobacco: Not on file  . Alcohol Use: Yes     Comment: socially  . Drug Use: Not on file  . Sexually Active: Not on file   Other Topics Concern  . Not on file   Social History Narrative   Lives at home with husband and 3 sons.    Past Surgical Hx:  Past Surgical History  Procedure Laterality Date  . Tonsillectomy    . Abdominal hysterectomy  02/28/11  . Laparoscopic cholecystectomy w/ cholangiography  07/03/11    Dr Jamey Ripa    Past Medical Hx:  Past Medical History  Diagnosis Date  . History of uterine cancer     s/p hysterectomy  . Family history of breast cancer     mother  . Gallstones 02/28/2011     s/p choley  . Multiple thyroid nodules     TSH nl 03/2012  . Prediabetes     A1C 5.9 03/2012  . Atrial  fibrillation 03/2012    spontaneously converted to NSR; CHA2DS2VASc score 1 (Female)  Colonoscopy in November.  Negative  Family Hx:  Family History  Problem Relation Age of Onset  . Cancer Mother     Breast  . Heart disease Father 73    CABG, Died age 58    Review of Systems:  Constitutional  Feels well reports weight gain Cardiovascular  No chest pain, shortness of breath, or edema nausea withafib Pulmonary  No cough or wheeze.  Gastro Intestinal No nausea, vomitting, or diarrhoea. Occasional  bright red blood per rectum, no abdominal pain, change in bowel movement, or constipation. Occasional pain with defecation. Genito Urinary  No frequency, urgency, dysuria, incontinence with sneeze or cough Musculo Skeletal  Myalgia with levothyroxine, no arthralgia, joint swelling or pain  Neurologic  No weakness, numbness, change in gait. Vasomotor instability 4 times/day, worse with stress.  Denies insomnia Psychology  No depression, anxiety, insomnia.     Vitals:  Blood pressure 120/72, pulse 70, temperature 97.9 F (36.6 C), temperature source Oral, resp. rate 20, height 5' 7.75" (1.721 m), weight 230 lb 6.4 oz (104.509 kg).  Physical Exam: WD female in NAD Neck  Supple without any enlargements.  Lymph node survey. No cervical supraclavicular cervical or inguinal adenopathy Cardiovascular  Pulse  normal rate, regularity and rhythm. S1 and S2 normal. Lungs  Clear to auscultation bilateraly, without wheezes/crackles/rhonchi. Good air movement.  Psychiatry  Alert and oriented to person, place, and time  Back No CVA tenderness Abdomen  Normoactive bowel sounds, abdomen soft, non-tender and obese. Surgical  sites intact without evidence of hernia.  Genito Urinary   Vulva/vagina: Normal external female genitalia.  No lesions.    Bladder/urethra:  No lesions or masses, atrophic, no lesions.  Vagina:Vagina atrophic, no masses Rectal  Good tone.  No external hemmorhoids. No  masses Good tone, no masses no cul de sac nodularity.  Extremities  No bilateral cyanosis, clubbing or edema.    [name], MD., PhD. 04/23/2013, 1:43 PM

## 2013-04-24 ENCOUNTER — Ambulatory Visit: Payer: BC Managed Care – PPO | Attending: Gynecologic Oncology | Admitting: Gynecologic Oncology

## 2013-04-24 ENCOUNTER — Encounter: Payer: Self-pay | Admitting: Gynecologic Oncology

## 2013-04-24 VITALS — BP 122/80 | HR 64 | Temp 97.5°F | Resp 16 | Ht 67.75 in | Wt 232.9 lb

## 2013-04-24 DIAGNOSIS — C549 Malignant neoplasm of corpus uteri, unspecified: Secondary | ICD-10-CM | POA: Insufficient documentation

## 2013-04-24 DIAGNOSIS — Z9079 Acquired absence of other genital organ(s): Secondary | ICD-10-CM | POA: Insufficient documentation

## 2013-04-24 DIAGNOSIS — Z90710 Acquired absence of both cervix and uterus: Secondary | ICD-10-CM | POA: Insufficient documentation

## 2013-04-24 DIAGNOSIS — E8941 Symptomatic postprocedural ovarian failure: Secondary | ICD-10-CM | POA: Insufficient documentation

## 2013-04-24 DIAGNOSIS — C541 Malignant neoplasm of endometrium: Secondary | ICD-10-CM

## 2013-04-24 DIAGNOSIS — R55 Syncope and collapse: Secondary | ICD-10-CM | POA: Insufficient documentation

## 2013-04-24 MED ORDER — CLONIDINE HCL 0.1 MG PO TABS: 0.1000 mg | ORAL_TABLET | ORAL | Status: DC

## 2013-04-24 NOTE — Patient Instructions (Signed)
F/U with Dr. Juliene Pina annually No evidence of disease    Thank you very much Ms. Melinda Carlson for allowing me to provide care for you today.  I appreciate your confidence in choosing our Gynecologic Oncology team.  If you have any questions about your visit today please call our office and we will get back to you as soon as possible.  Maryclare Labrador. Skyy Nilan MD., PhD Gynecologic Oncology

## 2013-04-24 NOTE — Progress Notes (Signed)
Office Visit  Note: Gyn-Onc   Melinda Carlson 51 y.o. female  CC:  Endometrial cancer surveillance, vasomotor instability   Assessment/Plan:  This is a 52 y.o. with Stage 1A endometrial cancer.  NED.  F/U in 12  months with Dr. Juliene Pina with Pap test F/U prn with Gyn Onc Counseled re signs and symptoms of recurrence Clonidine 0.1mg  daily   HPI: This is a 52 y.o. who underwent staging in April 2012 which was inclusive of a total abdominal hysterectomy bilateral salpingo-oophorectomy bilateral pelvic and periaortic lymph node dissection. Final pathology was consistent with a stage IA grade 1 endometrioid adenocarcinoma. The tumor involves endocervical glands without any endometrial stromal invasion. No lymphovascular space involvement or other high-risk features were noted. The tumor involved the myometrium to a depth of 5 mm where the myometrium was 13 mm.  Melinda Carlson reports 10-15 flashes daily. She reports awakening from sleep. She self discontinued the Celexa and feels that the hot flashes are not appreciably different off of Celexa.  Allergy:  Allergies  Allergen Reactions  . Naprosyn (Naproxen) Swelling    Social Hx:   History   Social History  . Marital Status: Married    Spouse Name: N/A    Number of Children: 3  . Years of Education: N/A   Occupational History  . [employer]    Social History Main Topics  . Smoking status: Never Smoker   . Smokeless tobacco: Not on file  . Alcohol Use: Yes     Comment: socially  . Drug Use: Not on file  . Sexually Active: Not on file   Other Topics Concern  . Not on file   Social History Narrative   Lives at home with husband and 3 sons.    Past Surgical Hx:  Past Surgical History  Procedure Laterality Date  . Tonsillectomy    . Abdominal hysterectomy  02/28/11  . Laparoscopic cholecystectomy w/ cholangiography  07/03/11    Dr Jamey Ripa    Past Medical Hx:  Past Medical History  Diagnosis Date  . History of  uterine cancer     s/p hysterectomy  . Family history of breast cancer     mother  . Gallstones 02/28/2011     s/p choley  . Multiple thyroid nodules     TSH nl 03/2012  . Prediabetes     A1C 5.9 03/2012  . Atrial fibrillation 03/2012    spontaneously converted to NSR; CHA2DS2VASc score 1 (Female)  Colonoscopy in November.  Negative  Family Hx:  Family History  Problem Relation Age of Onset  . Cancer Mother     Breast  . Heart disease Father 40    CABG, Died age 66    Review of Systems:  Constitutional  Feels well reports weight gain Cardiovascular  No chest pain, shortness of breath, or edema   Pulmonary  No cough or wheeze  Gastro Intestinal no nausea, vomitting, or diarrhoea. Occasional  bright red blood per rectum, no abdominal pain, change in bowel movement, or constipation. Occasional pain with defecation. Genito Urinary  No frequency, urgency, dysuria, incontinence with sneeze or cough Musculo Skeletal  Myalgia with levothyroxine, no arthralgia, joint swelling or pain  Neurologic  No weakness, numbness, change in gait. Vasomotor instability 10-15  times/day, worse with stress.  Denies insomnia.  Reports emotional liability Psychology  No depression, anxiety, insomnia.     Vitals:  Blood pressure 122/80, pulse 64, temperature 97.5 F (36.4 C), temperature source Oral, resp. rate  16, height 5' 7.75" (1.721 m), weight 232 lb 14.4 oz (105.643 kg).  Physical Exam: WD female in NAD Neck  Supple without any enlargements.  Lymph node survey. No cervical supraclavicular cervical or inguinal adenopathy Cardiovascular  Pulse normal rate, regularity and rhythm.  Lungs  Clear to auscultation bilateraly, without wheezes/crackles/rhonchi. Good air movement.  Psychiatry  Alert and oriented to person, place, and time  Back No CVA tenderness Abdomen  Normoactive bowel sounds, abdomen soft, non-tender and obese. Surgical midline incision intact without evidence of hernia.   Genito Urinary   Vulva/vagina: Normal external female genitalia.  No lesions.    Bladder/urethra:  No lesions or masses, atrophic, no lesions.  Vagina:Vagina atrophic, no masses Rectal  Good tone.  No external hemmorhoids. No masses Good tone, no masses no cul de sac nodularity.  Extremities  No bilateral cyanosis, clubbing or edema.    Melinda Schimke, MD., PhD. 04/24/2013, 10:54 AM

## 2013-04-29 ENCOUNTER — Other Ambulatory Visit: Payer: Self-pay | Admitting: Gynecologic Oncology

## 2013-04-29 DIAGNOSIS — R55 Syncope and collapse: Secondary | ICD-10-CM

## 2013-04-29 MED ORDER — CLONIDINE HCL 0.1 MG PO TABS: 0.1000 mg | ORAL_TABLET | Freq: Every day | ORAL | Status: DC

## 2013-06-16 ENCOUNTER — Telehealth: Payer: Self-pay | Admitting: Cardiology

## 2013-06-16 NOTE — Telephone Encounter (Signed)
Called patient back. She states that she was standing at a church service yesterday and felt like she was going to pass out. She got a drink of water and sat down and felt better. Later, while at the service she had resting midsternal chest pain lasting about 1 minute that went away on its own. No complaints of palpitations or SOB. She states that she has been under a lot of stress at work lately. Has appointment here in cardiology on 9/23. History of PAF but no CAD. Advised if has more events then will see sooner and if not to keep appointment on 9/23. Patient verbalized understanding.

## 2013-06-16 NOTE — Telephone Encounter (Signed)
New Problem  Pt states she is feeling chest discomfort// sharp pains// sch appt for sept 23// pt request to converse with a nurse about her recent pain.

## 2013-07-01 ENCOUNTER — Encounter: Payer: Self-pay | Admitting: Nurse Practitioner

## 2013-07-01 ENCOUNTER — Ambulatory Visit (INDEPENDENT_AMBULATORY_CARE_PROVIDER_SITE_OTHER): Payer: BC Managed Care – PPO | Admitting: Nurse Practitioner

## 2013-07-01 VITALS — BP 160/100 | HR 73 | Ht 67.75 in | Wt 234.8 lb

## 2013-07-01 DIAGNOSIS — I4891 Unspecified atrial fibrillation: Secondary | ICD-10-CM

## 2013-07-01 NOTE — Patient Instructions (Addendum)
We will arrange for a GXT  Stay on your current medicines  We will see you back as needed unless your GXT is abnormal  Call the [facility] office at [phone] if you have any questions, problems or concerns.

## 2013-07-01 NOTE — Progress Notes (Signed)
Melinda Carlson Date of Birth: [birth year] Medical Record #[id number]  History of Present Illness: Ms. Melinda Carlson is seen back today for a work in visit. Seen for Dr. Antoine Poche. Has a history of PAF, no known CAD. Other issues include depression, obesity, HTN, DM and hypothyroidism.   Last seen here in November. Was doing ok.   Comes back today. Here alone. Called earlier this month to report a spell of presyncope while at church - improved with drinking water - then had some short lived midsternal chest pain that resolved without intervention. Has been having a "few chest pains" described as a heavy sensation - nothing she can do to bring on or make go away. Not really associated with any associated symptoms. Has some palpitations at times but does not feel like she has had atrial fib any more. Not really using caffeine. Now diabetic and on medicine. Positive FH for CAD. Lipids checked by GYN. She does walk on her treadmill a couple of times a week with no symptoms. She does endorse increased stress. She is worried about her overall cardiac health.   Current Outpatient Prescriptions  Medication Sig Dispense Refill  . cetirizine (ZYRTEC) 10 MG tablet Take 10 mg by mouth daily.      Marland Kitchen levothyroxine (SYNTHROID, LEVOTHROID) 25 MCG tablet Take 25 mcg by mouth daily.      . metFORMIN (GLUCOPHAGE-XR) 500 MG 24 hr tablet Take 500 mg by mouth daily with breakfast.      . venlafaxine XR (EFFEXOR-XR) 37.5 MG 24 hr capsule Take 37.5 mg by mouth daily.        No current facility-administered medications for this visit.    Allergies  Allergen Reactions  . Naprosyn [Naproxen] Swelling    Past Medical History  Diagnosis Date  . History of uterine cancer     s/p hysterectomy  . Family history of breast cancer     mother  . Gallstones 02/28/2011     s/p choley  . Multiple thyroid nodules     TSH nl 03/2012  . Prediabetes     A1C 5.9 03/2012  . Atrial fibrillation 03/2012    spontaneously converted to  NSR; CHA2DS2VASc score 1 (Female)    Past Surgical History  Procedure Laterality Date  . Tonsillectomy    . Abdominal hysterectomy  02/28/11  . Laparoscopic cholecystectomy w/ cholangiography  07/03/11    Dr Jamey Ripa    History  Smoking status  . Never Smoker   Smokeless tobacco  . Not on file    History  Alcohol Use  . Yes    Comment: socially    Family History  Problem Relation Age of Onset  . Cancer Mother     Breast  . Heart disease Father 28    CABG, Died age 60    Review of Systems: The review of systems is per the HPI.  All other systems were reviewed and are negative.  Physical Exam: BP 160/100  Pulse 73  Ht 5' 7.75" (1.721 m)  Wt 234 lb 12.8 oz (106.505 kg)  BMI 35.96 kg/m2 Patient is very pleasant and in no acute distress. She is obese. Recheck of her BP is 130/80. Skin is warm and dry. Color is normal.  HEENT is unremarkable. Normocephalic/atraumatic. PERRL. Sclera are nonicteric. Neck is supple. No masses. No JVD. Lungs are clear. Cardiac exam shows a regular rate and rhythm. Abdomen is soft. Extremities are without edema. Gait and ROM are intact. No gross neurologic deficits noted.  LABORATORY DATA: EKG today shows sinus rhythm.   Lab Results  Component Value Date   WBC 10.9* 04/03/2012   HGB 13.3 04/03/2012   HCT 37.7 04/03/2012   PLT 220 04/03/2012   GLUCOSE 130* 04/03/2012   ALT 30 06/30/2011   AST 19 06/30/2011   NA 138 04/03/2012   K 4.0 04/03/2012   CL 103 04/03/2012   CREATININE 0.61 04/03/2012   BUN 10 04/03/2012   CO2 22 04/03/2012   TSH 1.406 04/04/2012   HGBA1C 5.9* 04/04/2012   Echo Study Conclusions from June 2013  - Left ventricle: The cavity size was normal. Wall thickness was increased in a pattern of mild LVH. Systolic function was normal. The estimated ejection fraction was in the range of 55% to 60%. Wall motion was normal; there were no regional wall motion abnormalities. - Mitral valve: Mild regurgitation.    Assessment /  Plan: 1. Presyncope/palpitations - with past history of PAF - in sinus today.   2. Chest pain - has DM, obesity, positive FH - will arrange for GXT to further define - regardless of outcome, needs to work on CV risk factor modification.   We will tentative see back prn unless her GXT is abnormal - I think a lot of her issues would improve with just engaging in good health habits - this was discussed at length with her today.   Patient is agreeable to this plan and will call if any problems develop in the interim.   Rosalio Macadamia, RN, ANP-C [facility] [address] [place], [place]  [address]

## 2013-07-04 ENCOUNTER — Other Ambulatory Visit: Payer: Self-pay

## 2013-07-04 ENCOUNTER — Ambulatory Visit (HOSPITAL_COMMUNITY)
Admission: RE | Admit: 2013-07-04 | Discharge: 2013-07-04 | Disposition: A | Discharge status: Home / Self Care | Payer: BC Managed Care – PPO | Source: Ambulatory Visit | Attending: Cardiology | Admitting: Cardiology

## 2013-07-04 DIAGNOSIS — I4891 Unspecified atrial fibrillation: Secondary | ICD-10-CM

## 2013-07-04 DIAGNOSIS — R0789 Other chest pain: Secondary | ICD-10-CM | POA: Insufficient documentation

## 2013-07-04 NOTE — Progress Notes (Signed)
GXT test performed. Target HR achieved. MD review of ECGs pending.

## 2013-07-07 ENCOUNTER — Encounter (INDEPENDENT_AMBULATORY_CARE_PROVIDER_SITE_OTHER): Payer: Self-pay

## 2013-08-29 ENCOUNTER — Ambulatory Visit
Admission: RE | Admit: 2013-08-29 | Discharge: 2013-08-29 | Disposition: A | Discharge status: Home / Self Care | Payer: BC Managed Care – PPO | Source: Ambulatory Visit | Attending: Endocrinology | Admitting: Endocrinology

## 2013-08-29 DIAGNOSIS — E049 Nontoxic goiter, unspecified: Secondary | ICD-10-CM

## 2013-12-30 ENCOUNTER — Other Ambulatory Visit: Payer: Self-pay | Admitting: Endocrinology

## 2013-12-30 DIAGNOSIS — E049 Nontoxic goiter, unspecified: Secondary | ICD-10-CM

## 2014-08-24 ENCOUNTER — Other Ambulatory Visit: Payer: BC Managed Care – PPO

## 2014-08-24 ENCOUNTER — Other Ambulatory Visit: Payer: Self-pay | Admitting: Endocrinology

## 2014-08-24 DIAGNOSIS — E041 Nontoxic single thyroid nodule: Secondary | ICD-10-CM

## 2015-02-15 ENCOUNTER — Ambulatory Visit
Admission: RE | Admit: 2015-02-15 | Discharge: 2015-02-15 | Disposition: A | Discharge status: Home / Self Care | Payer: BLUE CROSS/BLUE SHIELD | Source: Ambulatory Visit | Attending: Endocrinology | Admitting: Endocrinology

## 2015-02-15 DIAGNOSIS — E041 Nontoxic single thyroid nodule: Secondary | ICD-10-CM

## 2015-12-29 ENCOUNTER — Other Ambulatory Visit: Payer: Self-pay | Admitting: Endocrinology

## 2015-12-29 DIAGNOSIS — E049 Nontoxic goiter, unspecified: Secondary | ICD-10-CM

## 2015-12-31 ENCOUNTER — Ambulatory Visit
Admission: RE | Admit: 2015-12-31 | Discharge: 2015-12-31 | Disposition: A | Discharge status: Home / Self Care | Payer: BLUE CROSS/BLUE SHIELD | Source: Ambulatory Visit | Attending: Endocrinology | Admitting: Endocrinology

## 2015-12-31 ENCOUNTER — Other Ambulatory Visit: Payer: BLUE CROSS/BLUE SHIELD

## 2015-12-31 DIAGNOSIS — E049 Nontoxic goiter, unspecified: Secondary | ICD-10-CM

## 2016-02-29 DIAGNOSIS — N63 Unspecified lump in breast: Secondary | ICD-10-CM | POA: Diagnosis not present

## 2016-03-13 DIAGNOSIS — H5213 Myopia, bilateral: Secondary | ICD-10-CM | POA: Diagnosis not present

## 2016-04-03 DIAGNOSIS — H40003 Preglaucoma, unspecified, bilateral: Secondary | ICD-10-CM | POA: Diagnosis not present

## 2016-04-30 DIAGNOSIS — N3001 Acute cystitis with hematuria: Secondary | ICD-10-CM | POA: Diagnosis not present

## 2016-04-30 DIAGNOSIS — N3090 Cystitis, unspecified without hematuria: Secondary | ICD-10-CM | POA: Diagnosis not present

## 2016-06-15 DIAGNOSIS — H43812 Vitreous degeneration, left eye: Secondary | ICD-10-CM | POA: Diagnosis not present

## 2016-06-29 DIAGNOSIS — Z1389 Encounter for screening for other disorder: Secondary | ICD-10-CM | POA: Diagnosis not present

## 2016-06-29 DIAGNOSIS — Z1329 Encounter for screening for other suspected endocrine disorder: Secondary | ICD-10-CM | POA: Diagnosis not present

## 2016-06-29 DIAGNOSIS — C541 Malignant neoplasm of endometrium: Secondary | ICD-10-CM | POA: Diagnosis not present

## 2016-06-29 DIAGNOSIS — Z01419 Encounter for gynecological examination (general) (routine) without abnormal findings: Secondary | ICD-10-CM | POA: Diagnosis not present

## 2016-06-29 DIAGNOSIS — Z13220 Encounter for screening for lipoid disorders: Secondary | ICD-10-CM | POA: Diagnosis not present

## 2016-06-29 DIAGNOSIS — Z6837 Body mass index (BMI) 37.0-37.9, adult: Secondary | ICD-10-CM | POA: Diagnosis not present

## 2016-06-29 DIAGNOSIS — Z1151 Encounter for screening for human papillomavirus (HPV): Secondary | ICD-10-CM | POA: Diagnosis not present

## 2016-06-29 DIAGNOSIS — Z0000 Encounter for general adult medical examination without abnormal findings: Secondary | ICD-10-CM | POA: Diagnosis not present

## 2016-06-29 DIAGNOSIS — Z130 Encounter for screening for diseases of the blood and blood-forming organs and certain disorders involving the immune mechanism: Secondary | ICD-10-CM | POA: Diagnosis not present

## 2016-07-05 IMAGING — US US THYROID
1 series · 13 of 25 positions shown · non-contrast
Comparison: Thyroid ultrasound - 02/15/2015; 08/29/2013; 02/19/2013
; 08/12/2012

CLINICAL DATA: Follow-up thyroid nodules

EXAM:
THYROID ULTRASOUND
TECHNIQUE: Ultrasound examination of the thyroid gland and adjacent soft
tissues was performed.

[Series 1: us thyroid · 0.11mm/px · 13 of 53 slices shown]
[im 1/53]
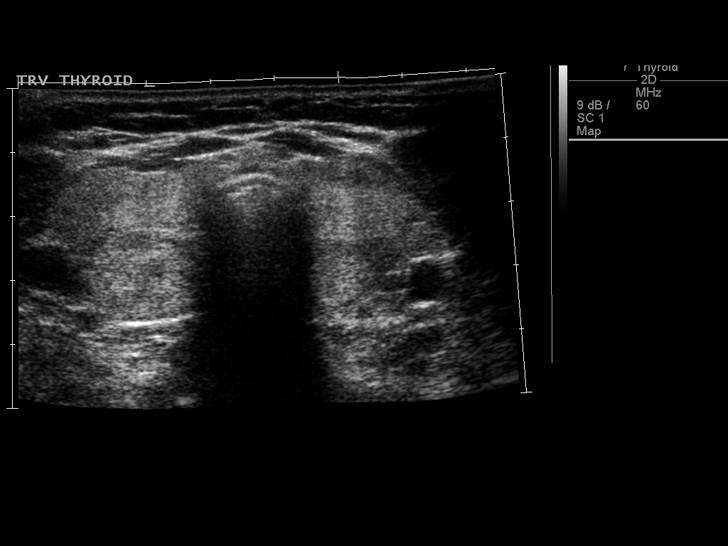
[im 5/53]
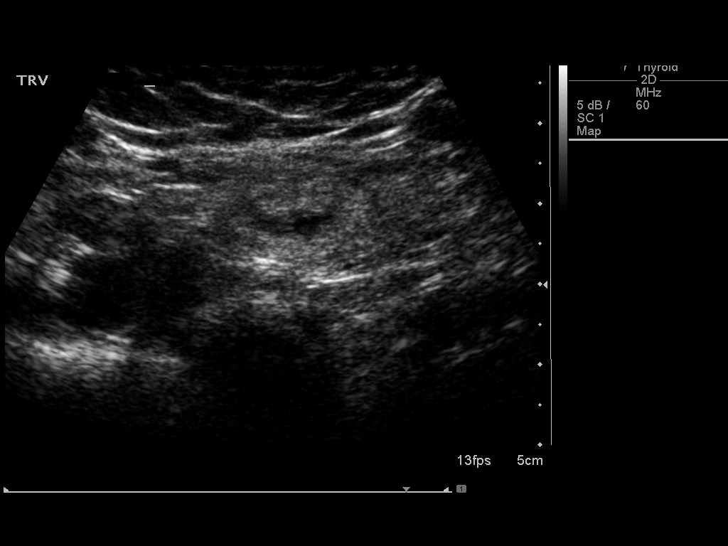
[im 9/53]
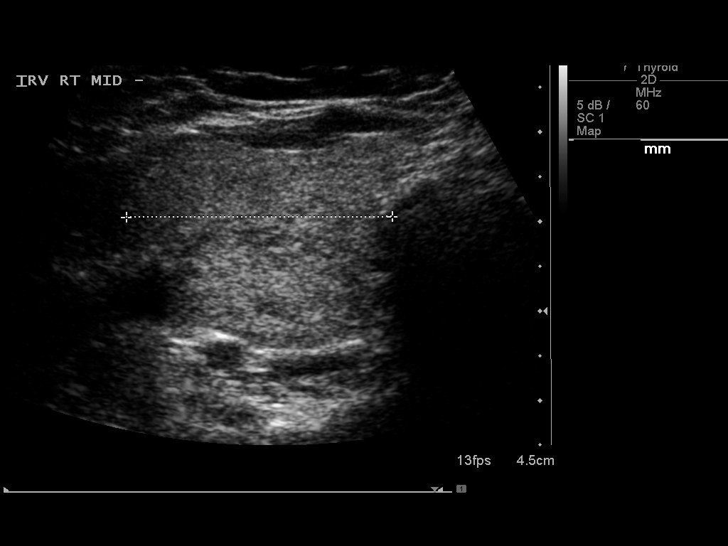
[im 14/53]
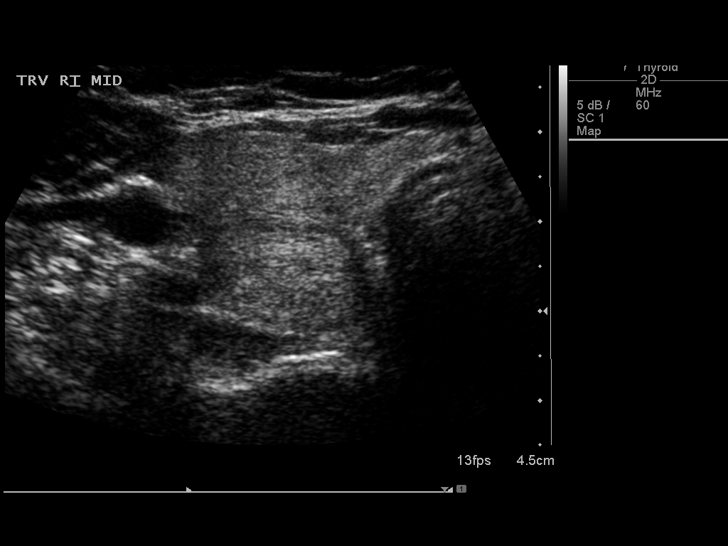
[im 18/53]
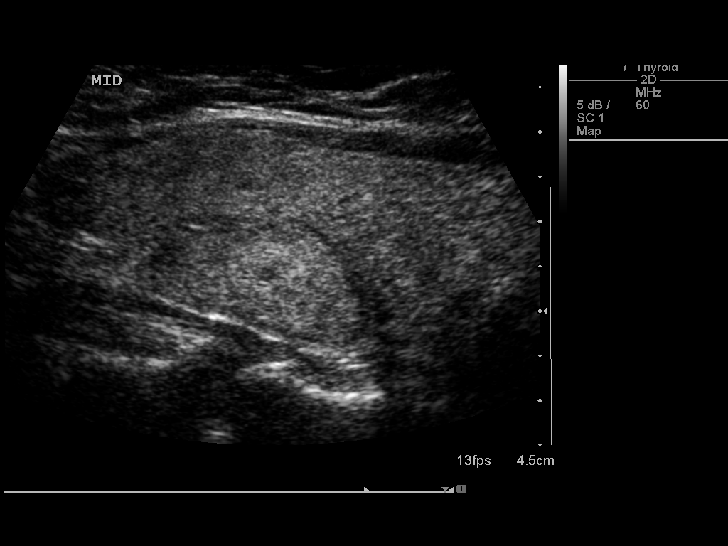
[im 22/53]
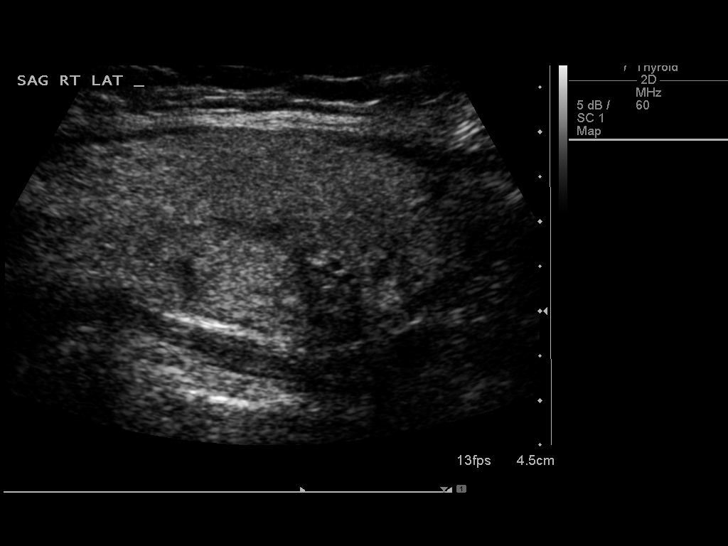
[im 27/53]
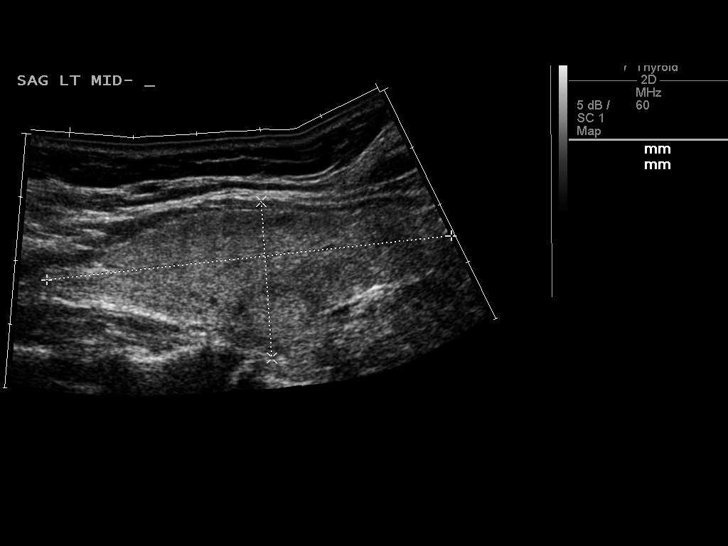
[im 31/53]
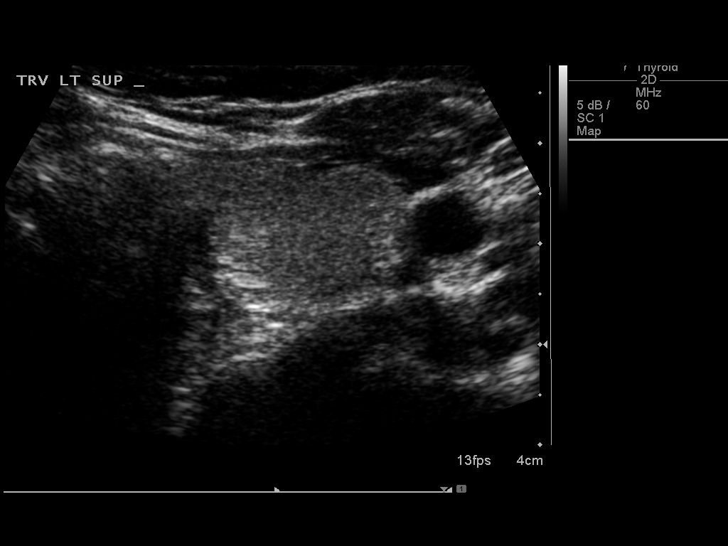
[im 35/53]
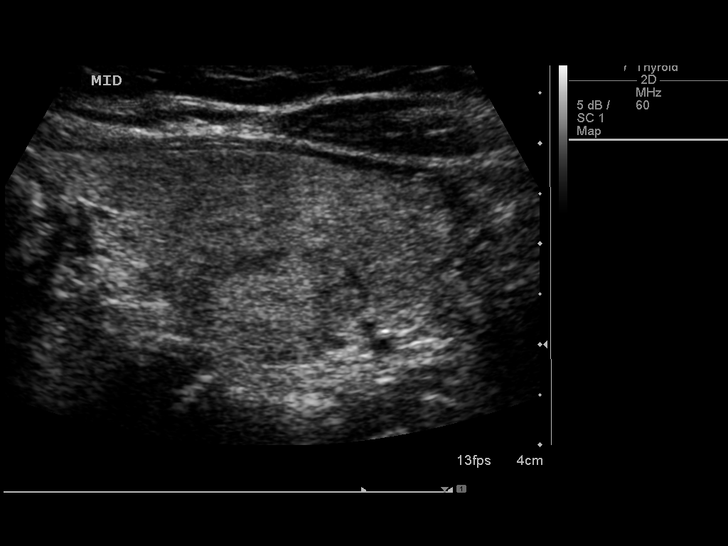
[im 40/53]
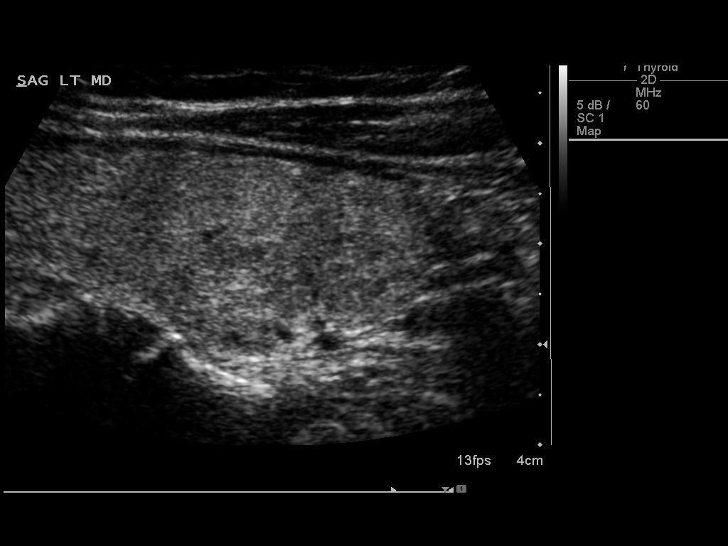
[im 44/53]
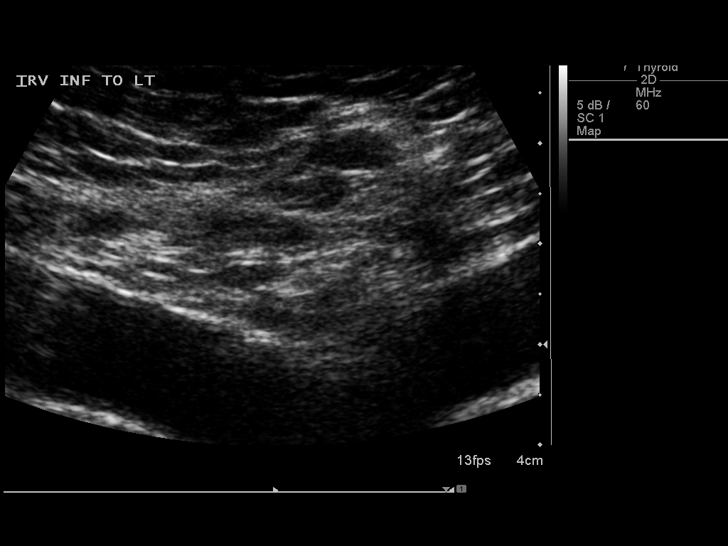
[im 48/53]
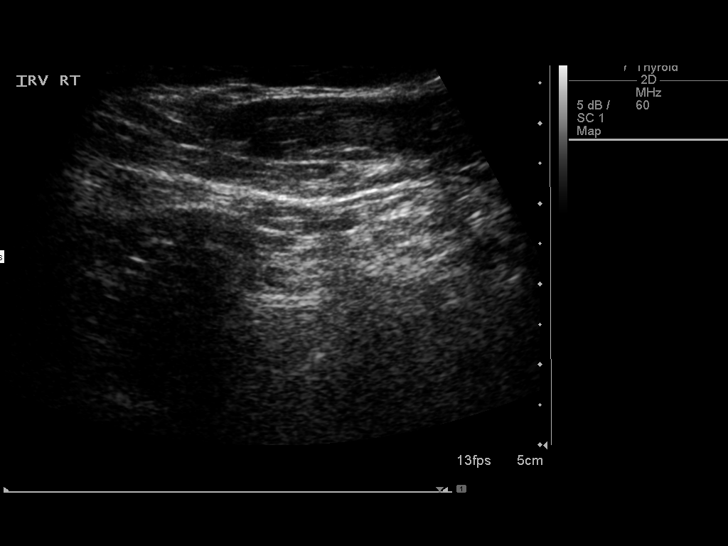
[im 53/53]
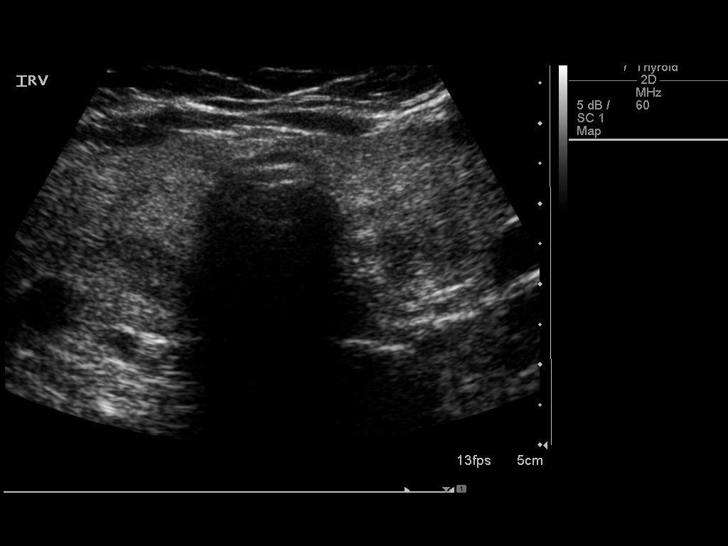

[13 of 25 positions shown; findings below may reference images not displayed]

FINDINGS: There is grossly unchanged mild diffuse heterogeneity of the thyroid
parenchymal echotexture.

Right thyroid lobe

Measurements: Enlarged measuring 6.2 x 2.5 x 3.0 cm, unchanged,
previously, 6.0 x 2.5 x 2.8 cm.

Right, mid, posterior - 1.8 x 1.3 x 2.6 cm - mixed echogenic, solid
- grossly unchanged since the [DATE] examination, previously, 2.2 x
1.1 x 1.7 cm

Left thyroid lobe

Measurements: Enlarged measuring 6.4 x 2.5 x 2.5 cm, grossly
unchanged, previously, 5.8 x 1.8 x 2.3 cm with slight differences
likely attributable to scan plane projection.

Left, mid, posterior - 1.7 x 1.5 x 1.2 cm - mixed echogenic, solid -
grossly unchanged since the [DATE] examination, previously, 1.4 x
1.0 x 1.4 cm

Left, inferior - 0.4 cm - hypoechoic, cystic - not discretely
measured on the prior examination

Isthmus

Thickness: Enlarged measuring 1.1 cm 0.9 cm in diameter, similar to
the [DATE] examination.

Mid, inferior- 1.3 x 1.0 x 1.3 cm - mixed echogenic, solid,
ill-defined, potentially a pseudo nodule - grossly unchanged since
the [DATE] examination, previously, 1.3 x 0.9 x 1.1 cm

Lymphadenopathy

None visualized.
IMPRESSION: Similar findings suggestive of multi nodular goiter. No definitive
new or enlarging thyroid nodules with the majority of thyroid
nodules appearing grossly unchanged since at least the [DATE]
examination.

## 2016-09-06 DIAGNOSIS — Z1231 Encounter for screening mammogram for malignant neoplasm of breast: Secondary | ICD-10-CM | POA: Diagnosis not present

## 2016-09-06 DIAGNOSIS — Z803 Family history of malignant neoplasm of breast: Secondary | ICD-10-CM | POA: Diagnosis not present

## 2016-12-20 DIAGNOSIS — L659 Nonscarring hair loss, unspecified: Secondary | ICD-10-CM | POA: Diagnosis not present

## 2016-12-20 DIAGNOSIS — E049 Nontoxic goiter, unspecified: Secondary | ICD-10-CM | POA: Diagnosis not present

## 2016-12-20 DIAGNOSIS — R7301 Impaired fasting glucose: Secondary | ICD-10-CM | POA: Diagnosis not present

## 2016-12-28 DIAGNOSIS — R7301 Impaired fasting glucose: Secondary | ICD-10-CM | POA: Diagnosis not present

## 2016-12-28 DIAGNOSIS — E049 Nontoxic goiter, unspecified: Secondary | ICD-10-CM | POA: Diagnosis not present

## 2016-12-28 DIAGNOSIS — L738 Other specified follicular disorders: Secondary | ICD-10-CM | POA: Diagnosis not present

## 2016-12-28 DIAGNOSIS — Z85828 Personal history of other malignant neoplasm of skin: Secondary | ICD-10-CM | POA: Diagnosis not present

## 2016-12-28 DIAGNOSIS — L579 Skin changes due to chronic exposure to nonionizing radiation, unspecified: Secondary | ICD-10-CM | POA: Diagnosis not present

## 2017-03-16 DIAGNOSIS — H5213 Myopia, bilateral: Secondary | ICD-10-CM | POA: Diagnosis not present

## 2017-05-08 DIAGNOSIS — R1310 Dysphagia, unspecified: Secondary | ICD-10-CM | POA: Diagnosis not present

## 2017-05-08 DIAGNOSIS — R194 Change in bowel habit: Secondary | ICD-10-CM | POA: Diagnosis not present

## 2017-05-08 DIAGNOSIS — Z1211 Encounter for screening for malignant neoplasm of colon: Secondary | ICD-10-CM | POA: Diagnosis not present

## 2017-05-08 DIAGNOSIS — R1031 Right lower quadrant pain: Secondary | ICD-10-CM | POA: Diagnosis not present

## 2017-05-08 DIAGNOSIS — E669 Obesity, unspecified: Secondary | ICD-10-CM | POA: Diagnosis not present

## 2017-05-23 DIAGNOSIS — R1310 Dysphagia, unspecified: Secondary | ICD-10-CM | POA: Diagnosis not present

## 2017-05-23 DIAGNOSIS — K2950 Unspecified chronic gastritis without bleeding: Secondary | ICD-10-CM | POA: Diagnosis not present

## 2017-05-23 DIAGNOSIS — Z1211 Encounter for screening for malignant neoplasm of colon: Secondary | ICD-10-CM | POA: Diagnosis not present

## 2017-05-23 DIAGNOSIS — K5730 Diverticulosis of large intestine without perforation or abscess without bleeding: Secondary | ICD-10-CM | POA: Diagnosis not present

## 2017-05-23 DIAGNOSIS — K317 Polyp of stomach and duodenum: Secondary | ICD-10-CM | POA: Diagnosis not present

## 2017-05-23 DIAGNOSIS — K449 Diaphragmatic hernia without obstruction or gangrene: Secondary | ICD-10-CM | POA: Diagnosis not present

## 2017-05-23 LAB — HM COLONOSCOPY

## 2017-07-06 DIAGNOSIS — Z130 Encounter for screening for diseases of the blood and blood-forming organs and certain disorders involving the immune mechanism: Secondary | ICD-10-CM | POA: Diagnosis not present

## 2017-07-06 DIAGNOSIS — Z0000 Encounter for general adult medical examination without abnormal findings: Secondary | ICD-10-CM | POA: Diagnosis not present

## 2017-07-06 DIAGNOSIS — Z01419 Encounter for gynecological examination (general) (routine) without abnormal findings: Secondary | ICD-10-CM | POA: Diagnosis not present

## 2017-07-06 DIAGNOSIS — Z13220 Encounter for screening for lipoid disorders: Secondary | ICD-10-CM | POA: Diagnosis not present

## 2017-07-06 DIAGNOSIS — Z6826 Body mass index (BMI) 26.0-26.9, adult: Secondary | ICD-10-CM | POA: Diagnosis not present

## 2017-07-12 DIAGNOSIS — H33002 Unspecified retinal detachment with retinal break, left eye: Secondary | ICD-10-CM | POA: Diagnosis not present

## 2017-07-12 DIAGNOSIS — H3322 Serous retinal detachment, left eye: Secondary | ICD-10-CM | POA: Diagnosis not present

## 2017-07-12 DIAGNOSIS — H538 Other visual disturbances: Secondary | ICD-10-CM | POA: Diagnosis not present

## 2017-07-12 DIAGNOSIS — H33012 Retinal detachment with single break, left eye: Secondary | ICD-10-CM | POA: Diagnosis not present

## 2017-07-13 DIAGNOSIS — H3322 Serous retinal detachment, left eye: Secondary | ICD-10-CM | POA: Diagnosis not present

## 2017-07-16 DIAGNOSIS — H5212 Myopia, left eye: Secondary | ICD-10-CM | POA: Diagnosis not present

## 2017-07-16 DIAGNOSIS — H33002 Unspecified retinal detachment with retinal break, left eye: Secondary | ICD-10-CM | POA: Diagnosis not present

## 2017-07-16 DIAGNOSIS — E039 Hypothyroidism, unspecified: Secondary | ICD-10-CM | POA: Diagnosis not present

## 2017-07-16 DIAGNOSIS — H3322 Serous retinal detachment, left eye: Secondary | ICD-10-CM | POA: Diagnosis not present

## 2017-07-16 DIAGNOSIS — Z79899 Other long term (current) drug therapy: Secondary | ICD-10-CM | POA: Diagnosis not present

## 2017-08-14 DIAGNOSIS — H33002 Unspecified retinal detachment with retinal break, left eye: Secondary | ICD-10-CM | POA: Diagnosis not present

## 2017-09-21 DIAGNOSIS — Z803 Family history of malignant neoplasm of breast: Secondary | ICD-10-CM | POA: Diagnosis not present

## 2017-09-21 DIAGNOSIS — Z1231 Encounter for screening mammogram for malignant neoplasm of breast: Secondary | ICD-10-CM | POA: Diagnosis not present

## 2017-11-01 DIAGNOSIS — H52222 Regular astigmatism, left eye: Secondary | ICD-10-CM | POA: Diagnosis not present

## 2017-11-01 DIAGNOSIS — H33052 Total retinal detachment, left eye: Secondary | ICD-10-CM | POA: Diagnosis not present

## 2017-11-01 DIAGNOSIS — H5213 Myopia, bilateral: Secondary | ICD-10-CM | POA: Diagnosis not present

## 2017-11-29 DIAGNOSIS — H35372 Puckering of macula, left eye: Secondary | ICD-10-CM | POA: Diagnosis not present

## 2017-11-29 DIAGNOSIS — H43392 Other vitreous opacities, left eye: Secondary | ICD-10-CM | POA: Diagnosis not present

## 2017-11-29 DIAGNOSIS — H269 Unspecified cataract: Secondary | ICD-10-CM | POA: Diagnosis not present

## 2017-11-29 DIAGNOSIS — H33002 Unspecified retinal detachment with retinal break, left eye: Secondary | ICD-10-CM | POA: Diagnosis not present

## 2017-12-10 ENCOUNTER — Other Ambulatory Visit: Payer: Self-pay | Admitting: Endocrinology

## 2017-12-10 DIAGNOSIS — E049 Nontoxic goiter, unspecified: Secondary | ICD-10-CM

## 2017-12-18 ENCOUNTER — Ambulatory Visit
Admission: RE | Admit: 2017-12-18 | Discharge: 2017-12-18 | Disposition: A | Discharge status: Home / Self Care | Payer: BLUE CROSS/BLUE SHIELD | Source: Ambulatory Visit | Attending: Endocrinology | Admitting: Endocrinology

## 2017-12-18 DIAGNOSIS — E041 Nontoxic single thyroid nodule: Secondary | ICD-10-CM | POA: Diagnosis not present

## 2017-12-18 DIAGNOSIS — E049 Nontoxic goiter, unspecified: Secondary | ICD-10-CM

## 2017-12-24 DIAGNOSIS — E049 Nontoxic goiter, unspecified: Secondary | ICD-10-CM | POA: Diagnosis not present

## 2017-12-24 DIAGNOSIS — R7301 Impaired fasting glucose: Secondary | ICD-10-CM | POA: Diagnosis not present

## 2017-12-27 DIAGNOSIS — E049 Nontoxic goiter, unspecified: Secondary | ICD-10-CM | POA: Diagnosis not present

## 2017-12-27 DIAGNOSIS — R7301 Impaired fasting glucose: Secondary | ICD-10-CM | POA: Diagnosis not present

## 2017-12-31 ENCOUNTER — Other Ambulatory Visit: Payer: Self-pay | Admitting: Endocrinology

## 2017-12-31 DIAGNOSIS — E049 Nontoxic goiter, unspecified: Secondary | ICD-10-CM

## 2018-01-18 ENCOUNTER — Other Ambulatory Visit (HOSPITAL_COMMUNITY)
Admission: RE | Admit: 2018-01-18 | Discharge: 2018-01-18 | Disposition: A | Discharge status: Home / Self Care | Payer: BLUE CROSS/BLUE SHIELD | Source: Ambulatory Visit | Attending: Student | Admitting: Student

## 2018-01-18 ENCOUNTER — Ambulatory Visit
Admission: RE | Admit: 2018-01-18 | Discharge: 2018-01-18 | Disposition: A | Discharge status: Home / Self Care | Payer: BLUE CROSS/BLUE SHIELD | Source: Ambulatory Visit | Attending: Endocrinology | Admitting: Endocrinology

## 2018-01-18 DIAGNOSIS — E041 Nontoxic single thyroid nodule: Secondary | ICD-10-CM | POA: Diagnosis not present

## 2018-01-18 DIAGNOSIS — E0789 Other specified disorders of thyroid: Secondary | ICD-10-CM | POA: Diagnosis not present

## 2018-01-18 DIAGNOSIS — E049 Nontoxic goiter, unspecified: Secondary | ICD-10-CM

## 2018-03-05 DIAGNOSIS — E042 Nontoxic multinodular goiter: Secondary | ICD-10-CM | POA: Diagnosis not present

## 2018-03-05 DIAGNOSIS — Z7289 Other problems related to lifestyle: Secondary | ICD-10-CM | POA: Diagnosis not present

## 2018-03-05 DIAGNOSIS — K219 Gastro-esophageal reflux disease without esophagitis: Secondary | ICD-10-CM | POA: Diagnosis not present

## 2018-03-18 DIAGNOSIS — L738 Other specified follicular disorders: Secondary | ICD-10-CM | POA: Diagnosis not present

## 2018-03-18 DIAGNOSIS — L988 Other specified disorders of the skin and subcutaneous tissue: Secondary | ICD-10-CM | POA: Diagnosis not present

## 2018-05-29 DIAGNOSIS — H33002 Unspecified retinal detachment with retinal break, left eye: Secondary | ICD-10-CM | POA: Diagnosis not present

## 2018-05-29 DIAGNOSIS — H25042 Posterior subcapsular polar age-related cataract, left eye: Secondary | ICD-10-CM | POA: Diagnosis not present

## 2018-05-29 DIAGNOSIS — H43391 Other vitreous opacities, right eye: Secondary | ICD-10-CM | POA: Diagnosis not present

## 2018-05-29 DIAGNOSIS — H35372 Puckering of macula, left eye: Secondary | ICD-10-CM | POA: Diagnosis not present

## 2018-06-11 DIAGNOSIS — H35372 Puckering of macula, left eye: Secondary | ICD-10-CM | POA: Diagnosis not present

## 2018-06-11 DIAGNOSIS — H338 Other retinal detachments: Secondary | ICD-10-CM | POA: Diagnosis not present

## 2018-06-11 DIAGNOSIS — H2512 Age-related nuclear cataract, left eye: Secondary | ICD-10-CM | POA: Diagnosis not present

## 2018-07-04 DIAGNOSIS — Z888 Allergy status to other drugs, medicaments and biological substances status: Secondary | ICD-10-CM | POA: Diagnosis not present

## 2018-07-04 DIAGNOSIS — H2512 Age-related nuclear cataract, left eye: Secondary | ICD-10-CM | POA: Diagnosis not present

## 2018-10-28 ENCOUNTER — Encounter: Payer: Self-pay | Admitting: Genetics

## 2018-11-06 DIAGNOSIS — Z1231 Encounter for screening mammogram for malignant neoplasm of breast: Secondary | ICD-10-CM | POA: Diagnosis not present

## 2018-11-06 DIAGNOSIS — Z803 Family history of malignant neoplasm of breast: Secondary | ICD-10-CM | POA: Diagnosis not present

## 2018-12-20 DIAGNOSIS — E049 Nontoxic goiter, unspecified: Secondary | ICD-10-CM | POA: Diagnosis not present

## 2018-12-20 DIAGNOSIS — R7301 Impaired fasting glucose: Secondary | ICD-10-CM | POA: Diagnosis not present

## 2019-01-01 DIAGNOSIS — E049 Nontoxic goiter, unspecified: Secondary | ICD-10-CM | POA: Diagnosis not present

## 2019-01-01 DIAGNOSIS — R7301 Impaired fasting glucose: Secondary | ICD-10-CM | POA: Diagnosis not present

## 2019-04-26 ENCOUNTER — Other Ambulatory Visit: Payer: Self-pay | Admitting: Endocrinology

## 2019-04-26 DIAGNOSIS — E049 Nontoxic goiter, unspecified: Secondary | ICD-10-CM

## 2019-05-02 ENCOUNTER — Ambulatory Visit
Admission: RE | Admit: 2019-05-02 | Discharge: 2019-05-02 | Disposition: A | Discharge status: Home / Self Care | Payer: BLUE CROSS/BLUE SHIELD | Source: Ambulatory Visit | Attending: Endocrinology | Admitting: Endocrinology

## 2019-05-02 DIAGNOSIS — E042 Nontoxic multinodular goiter: Secondary | ICD-10-CM | POA: Diagnosis not present

## 2019-05-02 DIAGNOSIS — E049 Nontoxic goiter, unspecified: Secondary | ICD-10-CM

## 2019-05-14 DIAGNOSIS — H2513 Age-related nuclear cataract, bilateral: Secondary | ICD-10-CM | POA: Diagnosis not present

## 2019-06-03 DIAGNOSIS — J988 Other specified respiratory disorders: Secondary | ICD-10-CM | POA: Diagnosis not present

## 2019-06-03 DIAGNOSIS — Z20828 Contact with and (suspected) exposure to other viral communicable diseases: Secondary | ICD-10-CM | POA: Diagnosis not present

## 2019-06-05 DIAGNOSIS — E039 Hypothyroidism, unspecified: Secondary | ICD-10-CM | POA: Diagnosis not present

## 2019-06-05 DIAGNOSIS — I4891 Unspecified atrial fibrillation: Secondary | ICD-10-CM | POA: Diagnosis not present

## 2019-06-05 DIAGNOSIS — H2511 Age-related nuclear cataract, right eye: Secondary | ICD-10-CM | POA: Diagnosis not present

## 2019-06-05 DIAGNOSIS — Z823 Family history of stroke: Secondary | ICD-10-CM | POA: Diagnosis not present

## 2019-06-05 DIAGNOSIS — K219 Gastro-esophageal reflux disease without esophagitis: Secondary | ICD-10-CM | POA: Diagnosis not present

## 2019-06-10 DIAGNOSIS — H3322 Serous retinal detachment, left eye: Secondary | ICD-10-CM | POA: Diagnosis not present

## 2019-06-10 DIAGNOSIS — H43391 Other vitreous opacities, right eye: Secondary | ICD-10-CM | POA: Diagnosis not present

## 2019-06-10 DIAGNOSIS — H35372 Puckering of macula, left eye: Secondary | ICD-10-CM | POA: Diagnosis not present

## 2019-06-26 DIAGNOSIS — Z20828 Contact with and (suspected) exposure to other viral communicable diseases: Secondary | ICD-10-CM | POA: Diagnosis not present

## 2019-07-21 DIAGNOSIS — R05 Cough: Secondary | ICD-10-CM | POA: Diagnosis not present

## 2019-07-21 DIAGNOSIS — Z20828 Contact with and (suspected) exposure to other viral communicable diseases: Secondary | ICD-10-CM | POA: Diagnosis not present

## 2019-07-21 DIAGNOSIS — J018 Other acute sinusitis: Secondary | ICD-10-CM | POA: Diagnosis not present

## 2019-08-12 DIAGNOSIS — Z124 Encounter for screening for malignant neoplasm of cervix: Secondary | ICD-10-CM | POA: Diagnosis not present

## 2019-08-12 DIAGNOSIS — Z6835 Body mass index (BMI) 35.0-35.9, adult: Secondary | ICD-10-CM | POA: Diagnosis not present

## 2019-08-12 DIAGNOSIS — Z1151 Encounter for screening for human papillomavirus (HPV): Secondary | ICD-10-CM | POA: Diagnosis not present

## 2019-08-12 DIAGNOSIS — Z23 Encounter for immunization: Secondary | ICD-10-CM | POA: Diagnosis not present

## 2019-08-12 DIAGNOSIS — Z01419 Encounter for gynecological examination (general) (routine) without abnormal findings: Secondary | ICD-10-CM | POA: Diagnosis not present

## 2019-08-23 DIAGNOSIS — R350 Frequency of micturition: Secondary | ICD-10-CM | POA: Diagnosis not present

## 2019-09-09 DIAGNOSIS — H43391 Other vitreous opacities, right eye: Secondary | ICD-10-CM | POA: Diagnosis not present

## 2019-09-09 DIAGNOSIS — H3322 Serous retinal detachment, left eye: Secondary | ICD-10-CM | POA: Diagnosis not present

## 2019-09-09 DIAGNOSIS — H35372 Puckering of macula, left eye: Secondary | ICD-10-CM | POA: Diagnosis not present

## 2019-10-29 DIAGNOSIS — H40003 Preglaucoma, unspecified, bilateral: Secondary | ICD-10-CM | POA: Diagnosis not present

## 2019-10-29 DIAGNOSIS — H35372 Puckering of macula, left eye: Secondary | ICD-10-CM | POA: Diagnosis not present

## 2019-10-29 DIAGNOSIS — H43391 Other vitreous opacities, right eye: Secondary | ICD-10-CM | POA: Diagnosis not present

## 2019-10-29 DIAGNOSIS — H40043 Steroid responder, bilateral: Secondary | ICD-10-CM | POA: Diagnosis not present

## 2019-11-12 DIAGNOSIS — Z1231 Encounter for screening mammogram for malignant neoplasm of breast: Secondary | ICD-10-CM | POA: Diagnosis not present

## 2019-12-29 DIAGNOSIS — E049 Nontoxic goiter, unspecified: Secondary | ICD-10-CM | POA: Diagnosis not present

## 2019-12-29 DIAGNOSIS — E785 Hyperlipidemia, unspecified: Secondary | ICD-10-CM | POA: Diagnosis not present

## 2019-12-29 DIAGNOSIS — R7301 Impaired fasting glucose: Secondary | ICD-10-CM | POA: Diagnosis not present

## 2020-01-02 DIAGNOSIS — R7301 Impaired fasting glucose: Secondary | ICD-10-CM | POA: Diagnosis not present

## 2020-01-02 DIAGNOSIS — E785 Hyperlipidemia, unspecified: Secondary | ICD-10-CM | POA: Diagnosis not present

## 2020-01-02 DIAGNOSIS — E049 Nontoxic goiter, unspecified: Secondary | ICD-10-CM | POA: Diagnosis not present

## 2020-01-09 IMAGING — US US THYROID
1 series · 12 of 25 positions shown · non-contrast
Comparison: 12/31/2015, 08/29/2013, 02/19/2013, 08/12/2012, biopsy
10/25/2011 of the isthmic nodule

CLINICAL DATA: 56-year-old female with a history of thyroid nodules

EXAM:
THYROID ULTRASOUND
TECHNIQUE: Ultrasound examination of the thyroid gland and adjacent soft
tissues was performed.

[Series 1: us thyroid · 0.06mm/px · 12 of 75 slices shown]
[im 4/75]
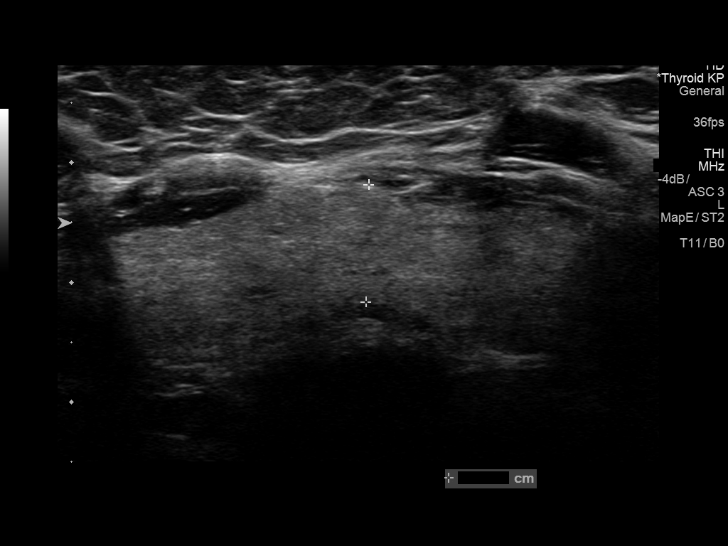
[im 10/75]
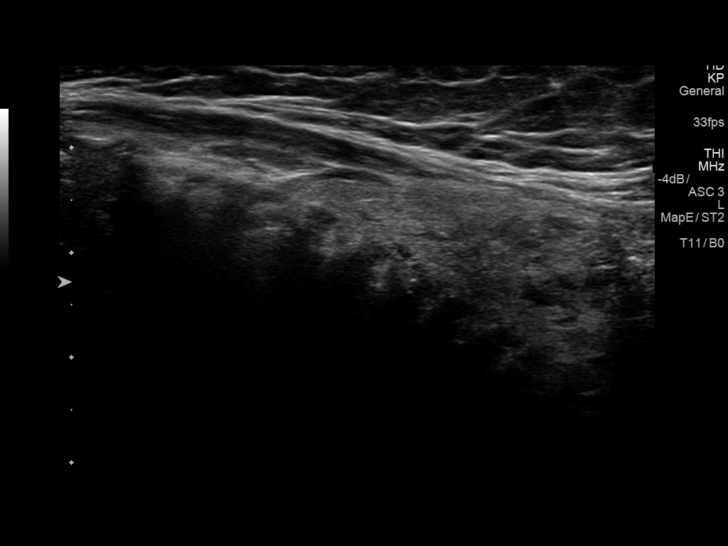
[im 16/75]
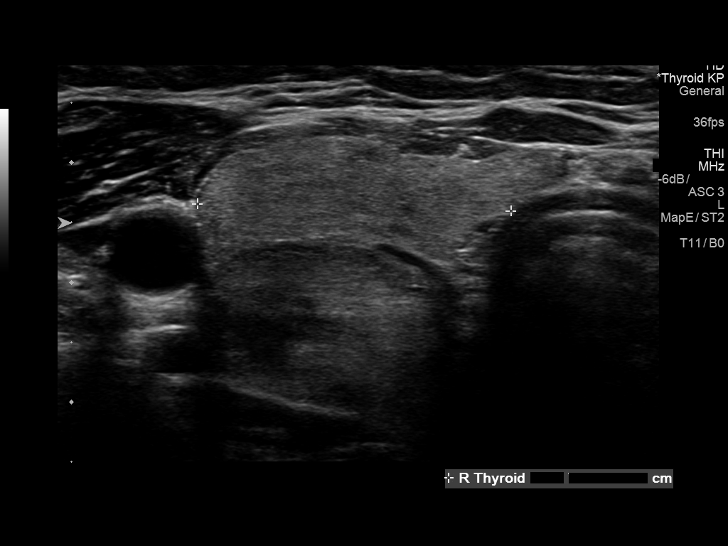
[im 22/75]
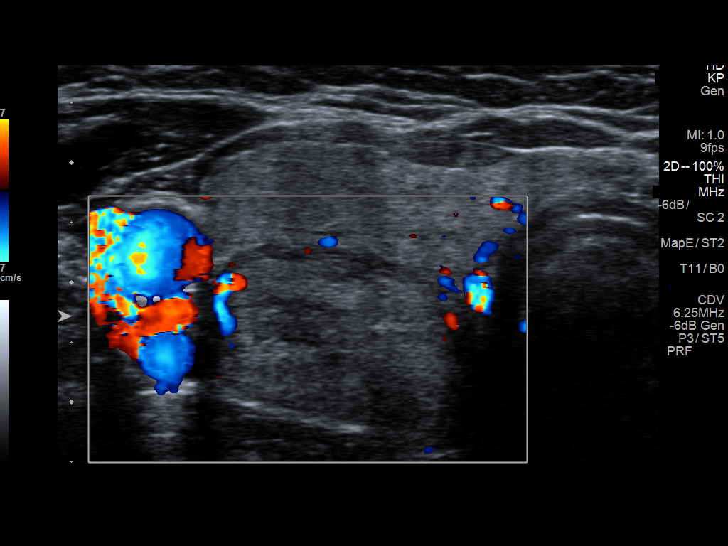
[im 28/75]
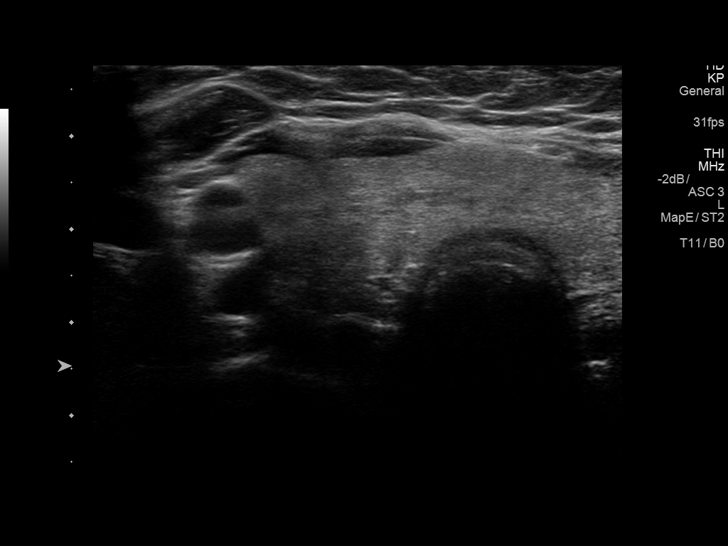
[im 34/75]
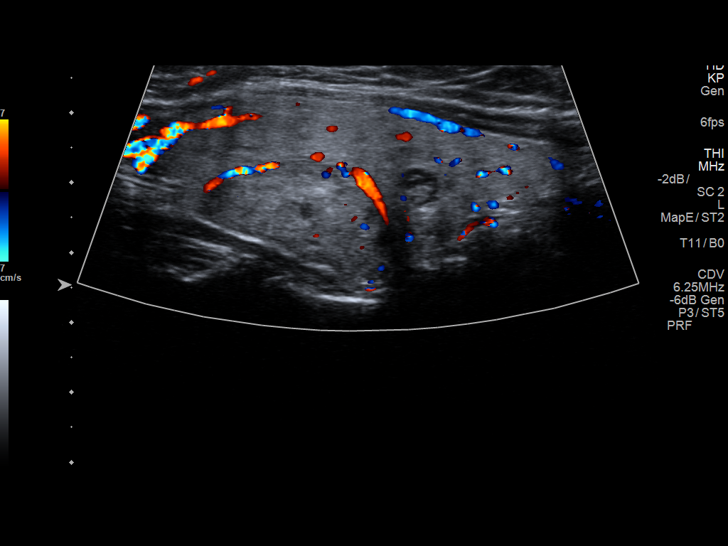
[im 41/75]
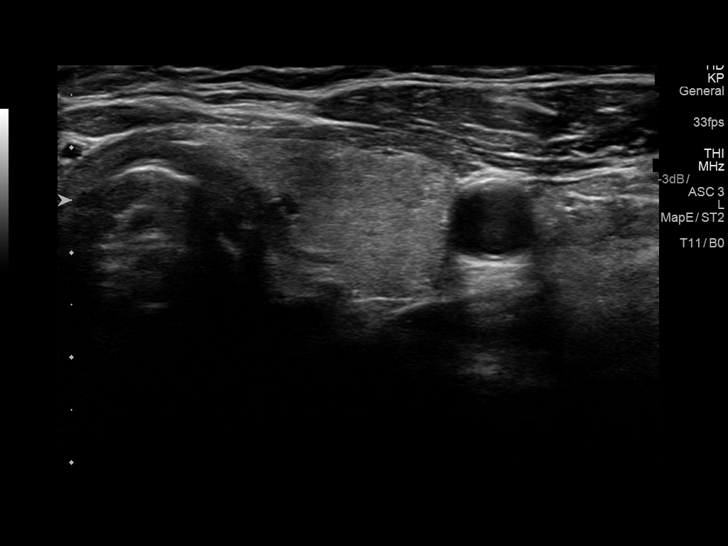
[im 47/75]
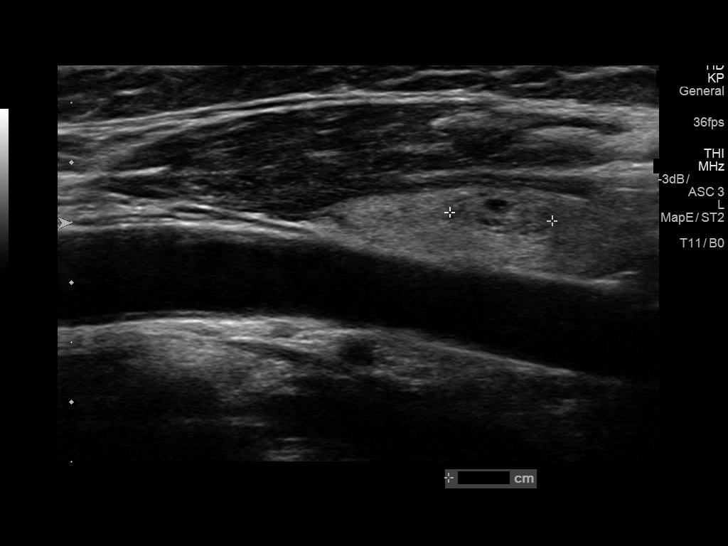
[im 53/75]
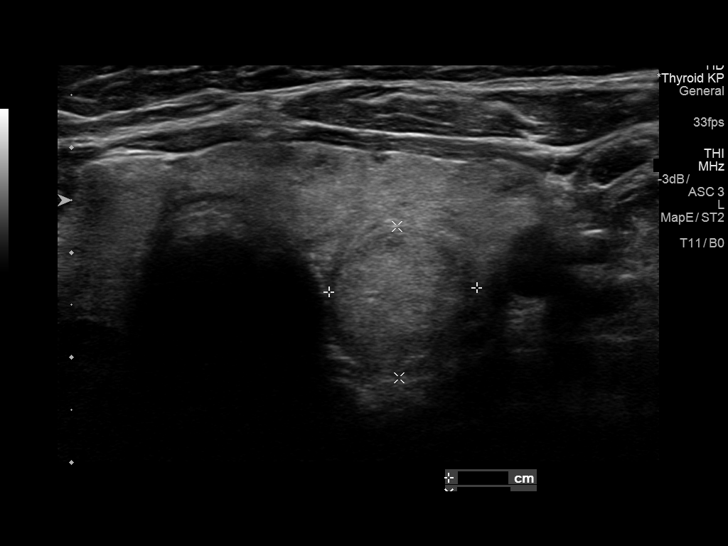
[im 59/75]
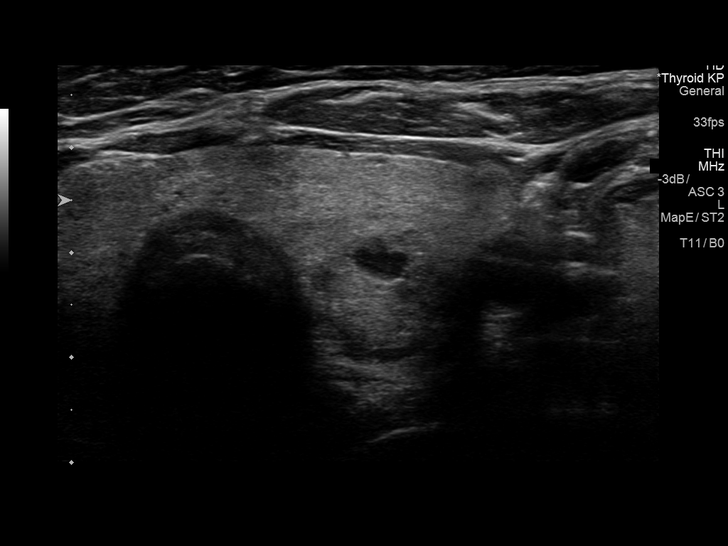
[im 65/75]
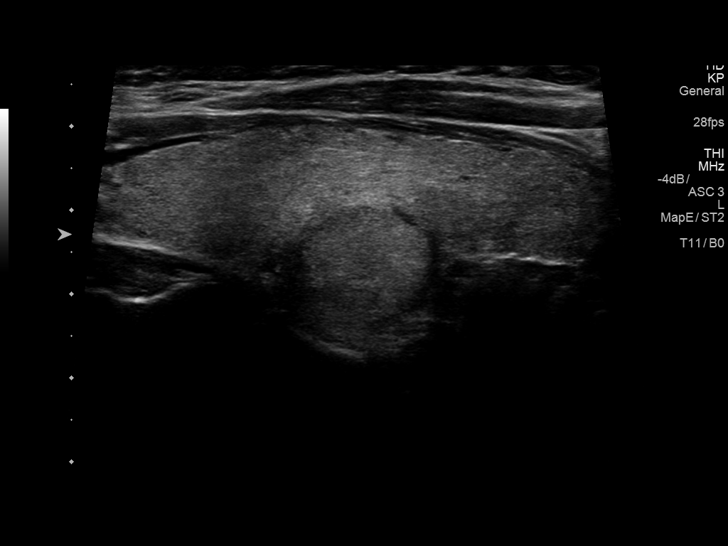
[im 71/75]
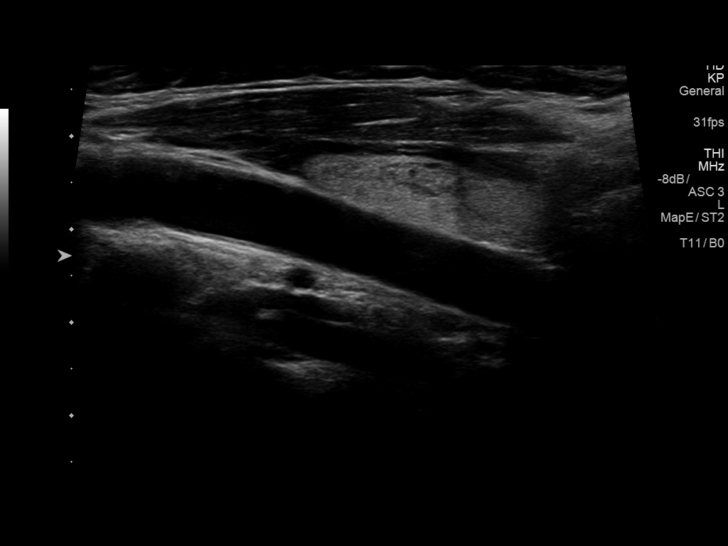

[12 of 25 positions shown; findings below may reference images not displayed]

FINDINGS: Parenchymal Echotexture: Mildly heterogenous

Isthmus: 1.0 cm

Right lobe: 6.2 cm x 2.6 cm x 2.6 cm

Left lobe: 6.0 cm x 2.5 cm x 2.6 cm

_________________________________________________________

Estimated total number of nodules >/= 1 cm: 3

Number of spongiform nodules >/=  2 cm not described below (TR1): 0

Number of mixed cystic and solid nodules >/= 1.5 cm not described
below (TR2): 0

_________________________________________________________

Nodule # 1:

Location: Isthmus; Mid

Maximum size: 1.4 cm; Other 2 dimensions: 1.1 cm x 1.3 cm

Composition: cannot determine (2)

Echogenicity: isoechoic (1)

Shape: not taller-than-wide (0)

Margins: ill-defined (0)

Echogenic foci: none (0)

ACR TI-RADS total points: 3.

ACR TI-RADS risk category: TR3 (3 points).

ACR TI-RADS recommendations:

This nodule has been previously biopsied.

_________________________________________________________

Nodule # 2:

Location: Right; Mid

Maximum size: 2.7 cm; Other 2 dimensions: 1.4 cm x 2.0 cm

Composition: solid/almost completely solid (2)

Echogenicity: isoechoic (1)

Shape: not taller-than-wide (0)

Margins: smooth (0)

Echogenic foci: none (0)

ACR TI-RADS total points: 3.

ACR TI-RADS risk category: TR3 (3 points).

ACR TI-RADS recommendations:

Nodule meets criteria for biopsy

_________________________________________________________

Nodule # 3:

Location: Right; Inferior

Maximum size: 0.8 cm; Other 2 dimensions: 0.6 cm x 0.6 cm

Composition: mixed cystic and solid (1)

Echogenicity: isoechoic (1)

Shape: not taller-than-wide (0)

Margins: smooth (0)

Echogenic foci: none (0)

ACR TI-RADS total points: 2.

ACR TI-RADS risk category: TR2 (2 points).

ACR TI-RADS recommendations:

Nodule does not meet criteria for surveillance or biopsy

_________________________________________________________

Nodule # 4:

Location: Left; Mid

Maximum size: 0.9 cm; Other 2 dimensions: 0.7 cm x 0.4 cm

Composition: spongiform (0)

ACR TI-RADS recommendations:

Spongiform nodule does not meet criteria for surveillance or biopsy

_________________________________________________________

Nodule # 5:

Location: Left; Mid

Maximum size: 0.6 cm; Other 2 dimensions: 0.5 cm x 0.3 cm

Composition: mixed cystic and solid (1)

Echogenicity: isoechoic (1)

Shape: not taller-than-wide (0)

Margins: smooth (0)

Echogenic foci: none (0)

ACR TI-RADS total points: 2.

ACR TI-RADS risk category: TR2 (2 points).

ACR TI-RADS recommendations:

Nodule does not meet criteria for surveillance or biopsy

_________________________________________________________

Nodule # 6:

Location: Left; Inferior

Maximum size: 1.6 cm; Other 2 dimensions: 1.4 cm x 1.4 cm

Composition: solid/almost completely solid (2)

Echogenicity: isoechoic (1)

Shape: not taller-than-wide (0)

Margins: smooth (0)

Echogenic foci: none (0)

ACR TI-RADS total points: 3.

ACR TI-RADS risk category: TR3 (3 points).

ACR TI-RADS recommendations:

Nodule meets criteria for surveillance

_________________________________________________________

No adenopathy
IMPRESSION: Right mid thyroid nodule (labeled 2) meets criteria for biopsy, as
designated by the newly established ACR TI-RADS criteria, and
referral for biopsy is recommended.

Left inferior thyroid nodule (labeled 6) meets criteria for
surveillance, as designated by the newly established ACR TI-RADS
criteria. Surveillance ultrasound study recommended to be performed
annually up to 5 years.

The isthmic nodule has been previously biopsied. Recommend
correlation with prior biopsy result.

None of the other nodules meet criteria for surveillance or biopsy.

Recommendations follow those established by the new ACR TI-RADS
criteria ([HOSPITAL] [id number];[DATE]).

## 2020-04-21 DIAGNOSIS — H40003 Preglaucoma, unspecified, bilateral: Secondary | ICD-10-CM | POA: Diagnosis not present

## 2020-04-21 DIAGNOSIS — H43391 Other vitreous opacities, right eye: Secondary | ICD-10-CM | POA: Diagnosis not present

## 2020-04-21 DIAGNOSIS — H40043 Steroid responder, bilateral: Secondary | ICD-10-CM | POA: Diagnosis not present

## 2020-04-21 DIAGNOSIS — H3322 Serous retinal detachment, left eye: Secondary | ICD-10-CM | POA: Diagnosis not present

## 2020-04-30 DIAGNOSIS — R7301 Impaired fasting glucose: Secondary | ICD-10-CM | POA: Diagnosis not present

## 2020-04-30 DIAGNOSIS — E049 Nontoxic goiter, unspecified: Secondary | ICD-10-CM | POA: Diagnosis not present

## 2020-04-30 DIAGNOSIS — E785 Hyperlipidemia, unspecified: Secondary | ICD-10-CM | POA: Diagnosis not present

## 2020-05-07 DIAGNOSIS — E049 Nontoxic goiter, unspecified: Secondary | ICD-10-CM | POA: Diagnosis not present

## 2020-05-07 DIAGNOSIS — R7301 Impaired fasting glucose: Secondary | ICD-10-CM | POA: Diagnosis not present

## 2020-05-07 DIAGNOSIS — E785 Hyperlipidemia, unspecified: Secondary | ICD-10-CM | POA: Diagnosis not present

## 2020-09-08 DIAGNOSIS — Z961 Presence of intraocular lens: Secondary | ICD-10-CM | POA: Diagnosis not present

## 2020-09-08 DIAGNOSIS — H3322 Serous retinal detachment, left eye: Secondary | ICD-10-CM | POA: Diagnosis not present

## 2020-09-08 DIAGNOSIS — H35372 Puckering of macula, left eye: Secondary | ICD-10-CM | POA: Diagnosis not present

## 2020-09-08 DIAGNOSIS — H43391 Other vitreous opacities, right eye: Secondary | ICD-10-CM | POA: Diagnosis not present

## 2020-10-27 DIAGNOSIS — H3322 Serous retinal detachment, left eye: Secondary | ICD-10-CM | POA: Diagnosis not present

## 2020-10-27 DIAGNOSIS — H40003 Preglaucoma, unspecified, bilateral: Secondary | ICD-10-CM | POA: Diagnosis not present

## 2020-10-27 DIAGNOSIS — H43391 Other vitreous opacities, right eye: Secondary | ICD-10-CM | POA: Diagnosis not present

## 2020-10-27 DIAGNOSIS — H35372 Puckering of macula, left eye: Secondary | ICD-10-CM | POA: Diagnosis not present

## 2020-11-05 DIAGNOSIS — E785 Hyperlipidemia, unspecified: Secondary | ICD-10-CM | POA: Diagnosis not present

## 2020-11-05 DIAGNOSIS — R7301 Impaired fasting glucose: Secondary | ICD-10-CM | POA: Diagnosis not present

## 2020-11-05 DIAGNOSIS — E049 Nontoxic goiter, unspecified: Secondary | ICD-10-CM | POA: Diagnosis not present

## 2020-11-12 DIAGNOSIS — E785 Hyperlipidemia, unspecified: Secondary | ICD-10-CM | POA: Diagnosis not present

## 2020-11-12 DIAGNOSIS — R7301 Impaired fasting glucose: Secondary | ICD-10-CM | POA: Diagnosis not present

## 2020-11-12 DIAGNOSIS — E049 Nontoxic goiter, unspecified: Secondary | ICD-10-CM | POA: Diagnosis not present

## 2020-11-15 ENCOUNTER — Other Ambulatory Visit: Payer: Self-pay | Admitting: Endocrinology

## 2020-11-15 DIAGNOSIS — E049 Nontoxic goiter, unspecified: Secondary | ICD-10-CM

## 2020-12-02 DIAGNOSIS — Z1231 Encounter for screening mammogram for malignant neoplasm of breast: Secondary | ICD-10-CM | POA: Diagnosis not present

## 2021-04-12 DIAGNOSIS — Z961 Presence of intraocular lens: Secondary | ICD-10-CM | POA: Diagnosis not present

## 2021-04-12 DIAGNOSIS — H35372 Puckering of macula, left eye: Secondary | ICD-10-CM | POA: Diagnosis not present

## 2021-04-12 DIAGNOSIS — H40003 Preglaucoma, unspecified, bilateral: Secondary | ICD-10-CM | POA: Diagnosis not present

## 2021-04-12 DIAGNOSIS — Z8669 Personal history of other diseases of the nervous system and sense organs: Secondary | ICD-10-CM | POA: Diagnosis not present

## 2021-05-23 IMAGING — US US THYROID
1 series · 13 of 25 positions shown · non-contrast
Comparison: None.

CLINICAL DATA: Goiter. 58-year-old female with a history of
multiple thyroid nodules. Patient previously underwent biopsy of the
isthmic nodule on 10/25/2011 and biopsy of the right mid thyroid
nodule on 01/18/2018.

EXAM:
THYROID ULTRASOUND
TECHNIQUE: Ultrasound examination of the thyroid gland and adjacent soft
tissues was performed.

[Series 1: us thyroid · 0.07mm/px · 13 of 52 slices shown]
[im 1/52]
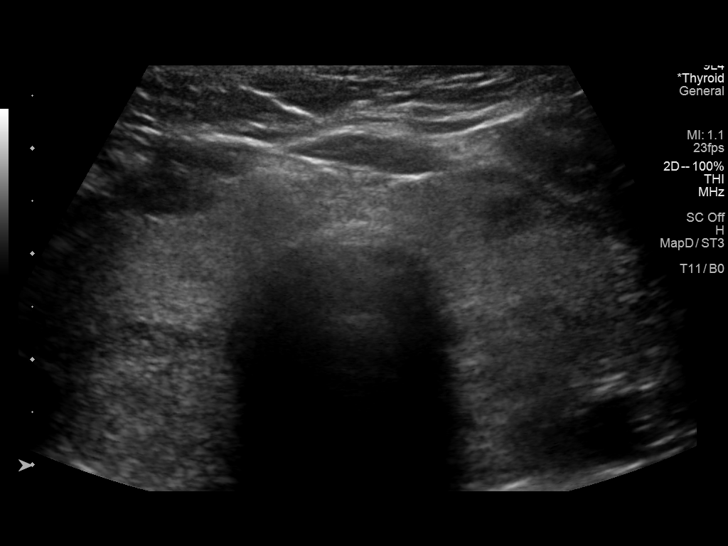
[im 5/52]
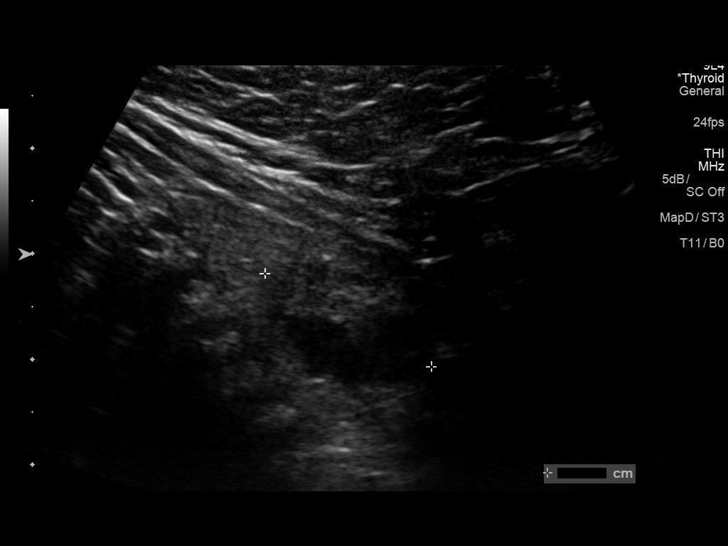
[im 9/52]
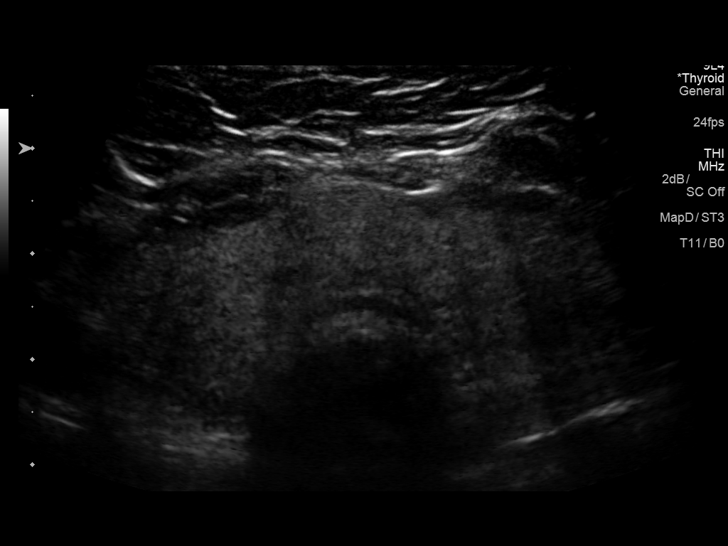
[im 13/52]
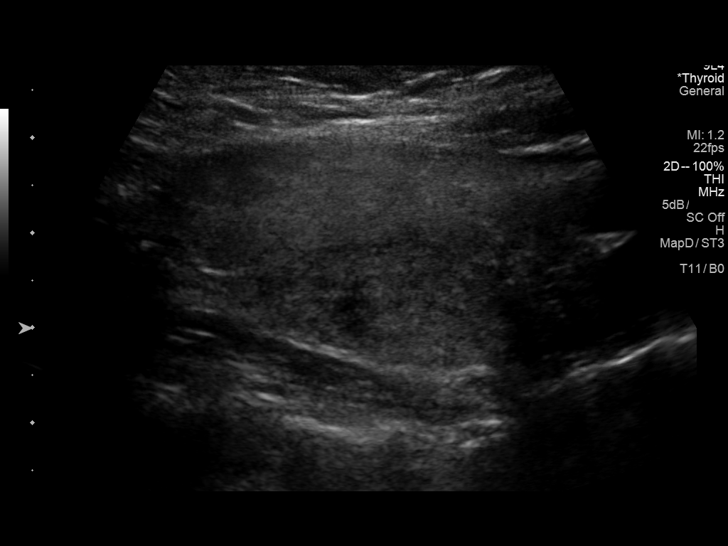
[im 18/52]
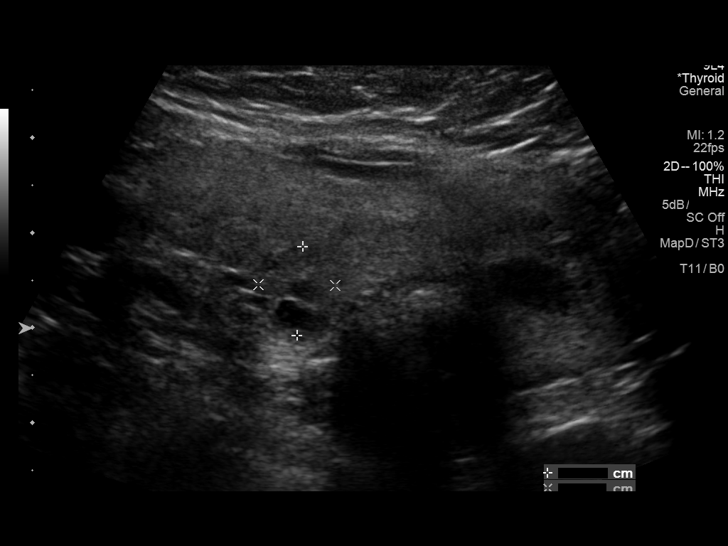
[im 22/52]
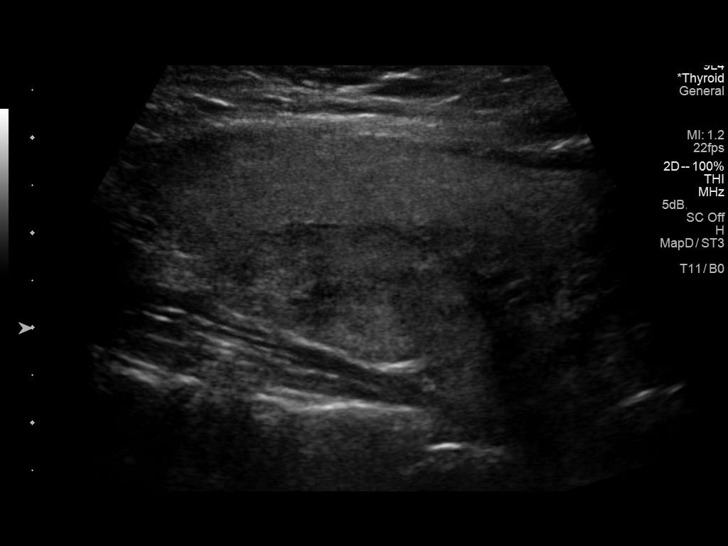
[im 26/52]
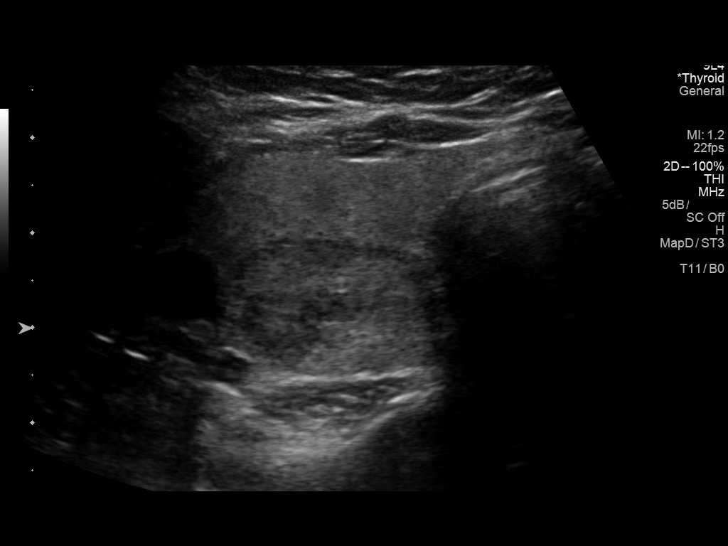
[im 30/52]
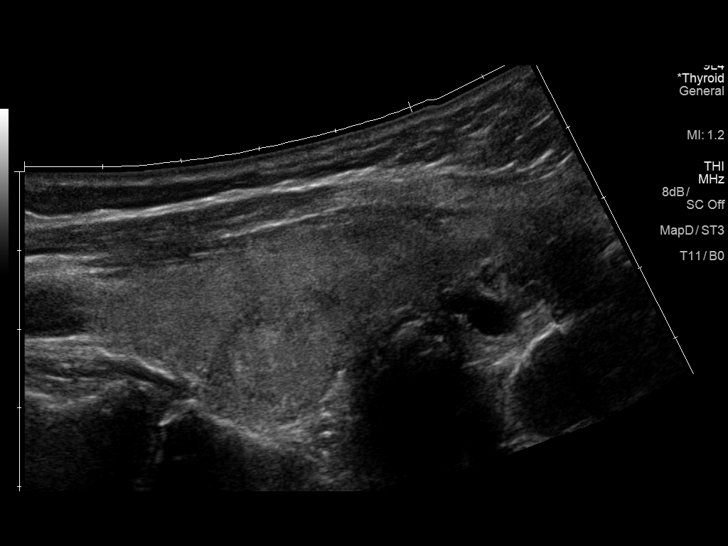
[im 35/52]
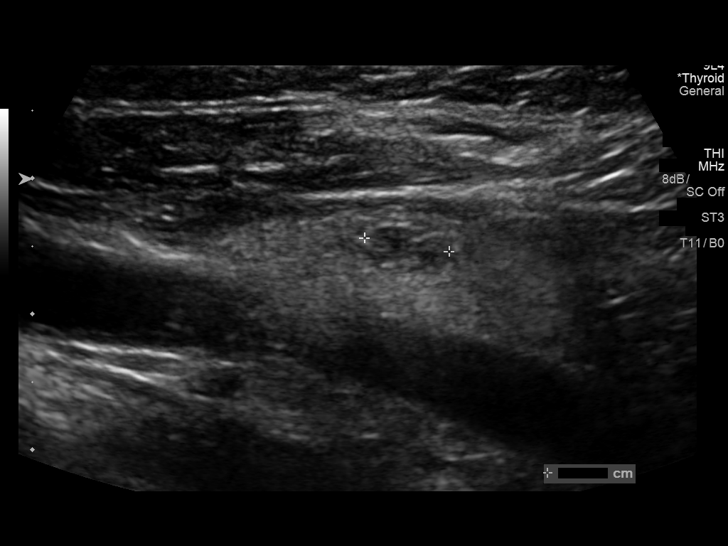
[im 39/52]
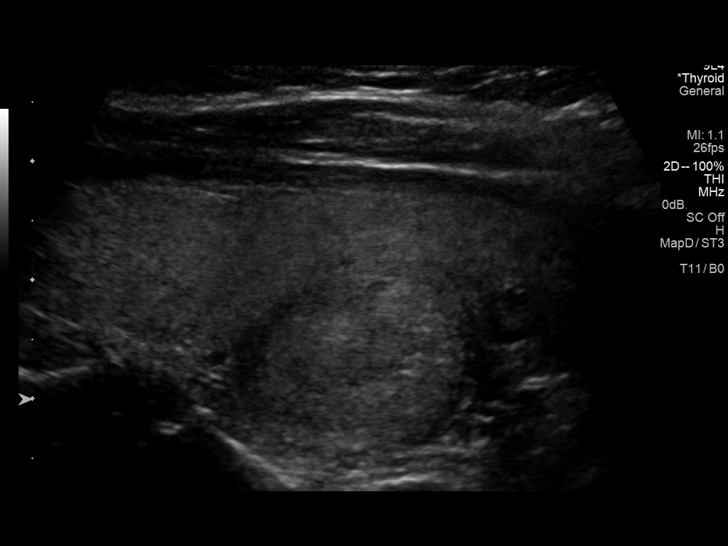
[im 43/52]
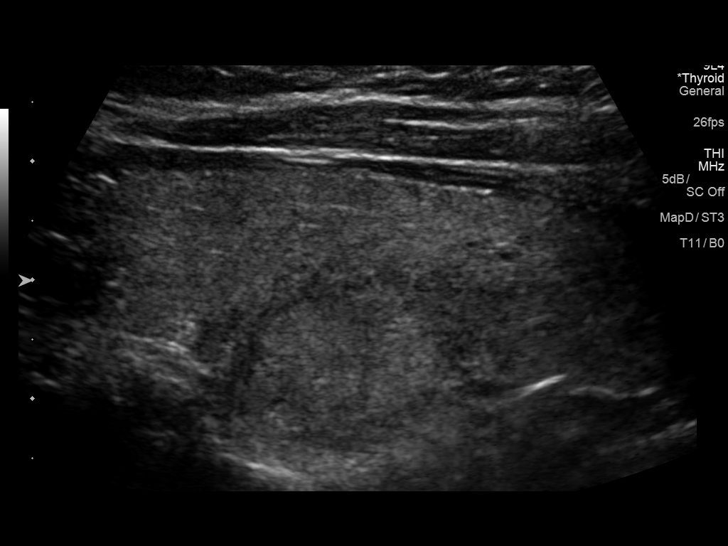
[im 47/52]
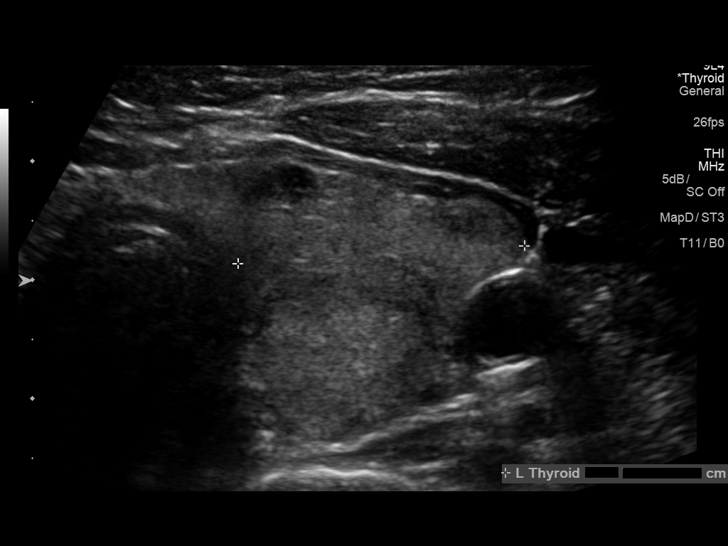
[im 52/52]
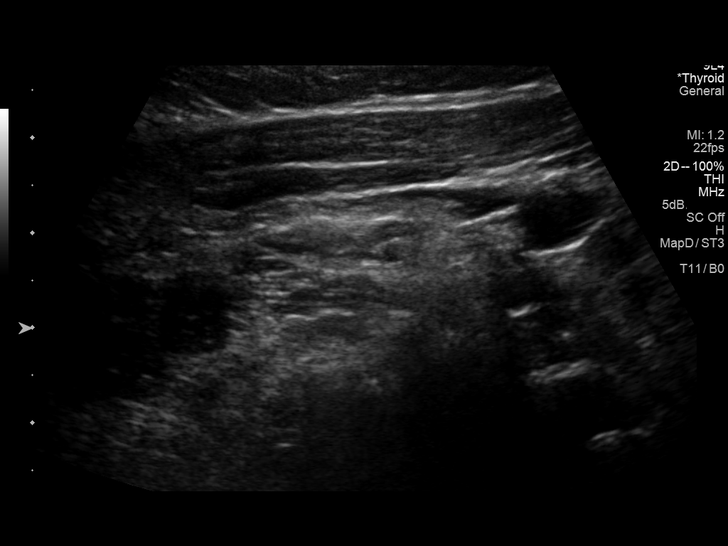

[13 of 25 positions shown; findings below may reference images not displayed]

FINDINGS: Parenchymal Echotexture: Moderately heterogenous

Isthmus: 1.0 cm

Right lobe: 6.3 x 2.9 x 2.4 cm

Left lobe: 6.1 x 2.4 x 2.4 cm

_________________________________________________________

Estimated total number of nodules >/= 1 cm: 3

Number of spongiform nodules >/=  2 cm not described below (TR1): 0

Number of mixed cystic and solid nodules >/= 1.5 cm not described
below (TR2): 0

_________________________________________________________

Nodule # 1: No significant interval change in the size or appearance
of the previously biopsied nodule in the thyroid isthmus. The nodule
measures approximately 1.5 x 1.5 x 1.3 cm. Greater than 5 year
stability is consistent with benignity.

_________________________________________________________

Nodule # 2: The previously biopsied nodule in the right mid gland is
unchanged at 2.4 x 2.4 x 1.5 cm. No new or suspicious features.

_________________________________________________________

Nodule # 6:

Prior biopsy: No

Location: Left; Inferior

Maximum size: 1.7 cm; Other 2 dimensions: 1.4 x 1.4 cm, previously,
1.6 x 1.4 x 1.4 cm in Thursday December, 2017 and 1.7 x 1.5 x 1.2 cm in [REDACTED]

Composition: solid/almost completely solid (2)

Echogenicity: isoechoic (1)

Shape: not taller-than-wide (0)

Margins: smooth (0)

Echogenic foci: none (0)

ACR TI-RADS total points: 3.

ACR TI-RADS risk category:  TR3 (3 points).

Significant change in size (>/= 20% in two dimensions and minimal
increase of 2 mm): No

Change in features: No

Change in ACR TI-RADS risk category: No

ACR TI-RADS recommendations:

*Given size (>/= 1.5 - 2.4 cm) and appearance, a follow-up
ultrasound in 1 year should be considered based on TI-RADS criteria.

_________________________________________________________
IMPRESSION: 1. The approximately 1.7 cm TI-RADS category 3 nodule in the left
mid to lower gland remains stable dating back to Tuesday December, 2015.
Recommend 1 additional follow-up ultrasound in 2 years (December 2020)
to confirm 5 year stability.
2. No significant interval change in the size or appearance of the
previously biopsied isthmic nodule. Greater than 5 year stability is
consistent with benignity.
3. No significant interval change in the size or appearance of the
previously biopsied right mid thyroid nodule. Recommend correlation
with prior biopsy results.

The above is in keeping with the ACR TI-RADS recommendations - [HOSPITAL] [id number];[DATE].

## 2021-11-11 DIAGNOSIS — R7301 Impaired fasting glucose: Secondary | ICD-10-CM | POA: Diagnosis not present

## 2021-11-11 DIAGNOSIS — E785 Hyperlipidemia, unspecified: Secondary | ICD-10-CM | POA: Diagnosis not present

## 2021-11-11 DIAGNOSIS — E049 Nontoxic goiter, unspecified: Secondary | ICD-10-CM | POA: Diagnosis not present

## 2021-11-18 DIAGNOSIS — R7301 Impaired fasting glucose: Secondary | ICD-10-CM | POA: Diagnosis not present

## 2021-11-18 DIAGNOSIS — E785 Hyperlipidemia, unspecified: Secondary | ICD-10-CM | POA: Diagnosis not present

## 2021-11-18 DIAGNOSIS — E049 Nontoxic goiter, unspecified: Secondary | ICD-10-CM | POA: Diagnosis not present

## 2021-12-08 DIAGNOSIS — Z1231 Encounter for screening mammogram for malignant neoplasm of breast: Secondary | ICD-10-CM | POA: Diagnosis not present

## 2022-01-19 DIAGNOSIS — Z961 Presence of intraocular lens: Secondary | ICD-10-CM | POA: Diagnosis not present

## 2022-01-19 DIAGNOSIS — H43391 Other vitreous opacities, right eye: Secondary | ICD-10-CM | POA: Diagnosis not present

## 2022-01-19 DIAGNOSIS — H40003 Preglaucoma, unspecified, bilateral: Secondary | ICD-10-CM | POA: Diagnosis not present

## 2022-01-19 DIAGNOSIS — H35372 Puckering of macula, left eye: Secondary | ICD-10-CM | POA: Diagnosis not present

## 2022-02-24 DIAGNOSIS — H43813 Vitreous degeneration, bilateral: Secondary | ICD-10-CM | POA: Diagnosis not present

## 2022-02-24 DIAGNOSIS — H35372 Puckering of macula, left eye: Secondary | ICD-10-CM | POA: Diagnosis not present

## 2022-02-24 DIAGNOSIS — H26492 Other secondary cataract, left eye: Secondary | ICD-10-CM | POA: Diagnosis not present

## 2022-02-24 DIAGNOSIS — H40003 Preglaucoma, unspecified, bilateral: Secondary | ICD-10-CM | POA: Diagnosis not present

## 2022-05-18 DIAGNOSIS — H35372 Puckering of macula, left eye: Secondary | ICD-10-CM | POA: Diagnosis not present

## 2022-05-18 DIAGNOSIS — H43391 Other vitreous opacities, right eye: Secondary | ICD-10-CM | POA: Diagnosis not present

## 2022-05-18 DIAGNOSIS — H3322 Serous retinal detachment, left eye: Secondary | ICD-10-CM | POA: Diagnosis not present

## 2022-07-13 DIAGNOSIS — L728 Other follicular cysts of the skin and subcutaneous tissue: Secondary | ICD-10-CM | POA: Diagnosis not present

## 2022-07-13 DIAGNOSIS — L821 Other seborrheic keratosis: Secondary | ICD-10-CM | POA: Diagnosis not present

## 2022-08-24 DIAGNOSIS — J111 Influenza due to unidentified influenza virus with other respiratory manifestations: Secondary | ICD-10-CM | POA: Diagnosis not present

## 2022-09-15 DIAGNOSIS — H40003 Preglaucoma, unspecified, bilateral: Secondary | ICD-10-CM | POA: Diagnosis not present

## 2022-09-15 DIAGNOSIS — Z961 Presence of intraocular lens: Secondary | ICD-10-CM | POA: Diagnosis not present

## 2022-09-15 DIAGNOSIS — H35372 Puckering of macula, left eye: Secondary | ICD-10-CM | POA: Diagnosis not present

## 2022-09-15 DIAGNOSIS — H33002 Unspecified retinal detachment with retinal break, left eye: Secondary | ICD-10-CM | POA: Diagnosis not present

## 2022-10-16 ENCOUNTER — Other Ambulatory Visit: Payer: Self-pay | Admitting: Endocrinology

## 2022-10-16 DIAGNOSIS — E049 Nontoxic goiter, unspecified: Secondary | ICD-10-CM

## 2022-10-24 ENCOUNTER — Other Ambulatory Visit: Payer: BC Managed Care – PPO

## 2022-11-13 ENCOUNTER — Ambulatory Visit: Payer: BC Managed Care – PPO | Admitting: Nurse Practitioner

## 2022-11-17 DIAGNOSIS — E049 Nontoxic goiter, unspecified: Secondary | ICD-10-CM | POA: Diagnosis not present

## 2022-11-17 DIAGNOSIS — R7301 Impaired fasting glucose: Secondary | ICD-10-CM | POA: Diagnosis not present

## 2022-11-17 DIAGNOSIS — E785 Hyperlipidemia, unspecified: Secondary | ICD-10-CM | POA: Diagnosis not present

## 2022-11-23 ENCOUNTER — Ambulatory Visit
Admission: RE | Admit: 2022-11-23 | Discharge: 2022-11-23 | Disposition: A | Discharge status: Home / Self Care | Payer: BC Managed Care – PPO | Source: Ambulatory Visit | Attending: Endocrinology | Admitting: Endocrinology

## 2022-11-23 DIAGNOSIS — E049 Nontoxic goiter, unspecified: Secondary | ICD-10-CM

## 2022-11-23 DIAGNOSIS — E042 Nontoxic multinodular goiter: Secondary | ICD-10-CM | POA: Diagnosis not present

## 2022-11-24 DIAGNOSIS — E049 Nontoxic goiter, unspecified: Secondary | ICD-10-CM | POA: Diagnosis not present

## 2022-11-24 DIAGNOSIS — R7301 Impaired fasting glucose: Secondary | ICD-10-CM | POA: Diagnosis not present

## 2022-11-24 DIAGNOSIS — E785 Hyperlipidemia, unspecified: Secondary | ICD-10-CM | POA: Diagnosis not present

## 2022-11-28 ENCOUNTER — Other Ambulatory Visit: Payer: Self-pay | Admitting: Endocrinology

## 2022-11-28 DIAGNOSIS — E049 Nontoxic goiter, unspecified: Secondary | ICD-10-CM

## 2022-12-13 ENCOUNTER — Other Ambulatory Visit (HOSPITAL_COMMUNITY)
Admission: RE | Admit: 2022-12-13 | Discharge: 2022-12-13 | Disposition: A | Discharge status: Home / Self Care | Payer: BC Managed Care – PPO | Source: Ambulatory Visit | Attending: Endocrinology | Admitting: Endocrinology

## 2022-12-13 ENCOUNTER — Ambulatory Visit
Admission: RE | Admit: 2022-12-13 | Discharge: 2022-12-13 | Disposition: A | Discharge status: Home / Self Care | Payer: BC Managed Care – PPO | Source: Ambulatory Visit | Attending: Endocrinology | Admitting: Endocrinology

## 2022-12-13 DIAGNOSIS — E049 Nontoxic goiter, unspecified: Secondary | ICD-10-CM

## 2022-12-13 DIAGNOSIS — E041 Nontoxic single thyroid nodule: Secondary | ICD-10-CM | POA: Diagnosis not present

## 2022-12-14 DIAGNOSIS — Z803 Family history of malignant neoplasm of breast: Secondary | ICD-10-CM | POA: Diagnosis not present

## 2022-12-14 DIAGNOSIS — Z1231 Encounter for screening mammogram for malignant neoplasm of breast: Secondary | ICD-10-CM | POA: Diagnosis not present

## 2022-12-14 LAB — HM MAMMOGRAPHY

## 2022-12-15 LAB — CYTOLOGY - NON PAP

## 2022-12-19 DIAGNOSIS — E042 Nontoxic multinodular goiter: Secondary | ICD-10-CM | POA: Diagnosis not present

## 2022-12-21 DIAGNOSIS — R928 Other abnormal and inconclusive findings on diagnostic imaging of breast: Secondary | ICD-10-CM | POA: Diagnosis not present

## 2022-12-26 ENCOUNTER — Encounter (HOSPITAL_COMMUNITY): Payer: Self-pay

## 2023-01-03 ENCOUNTER — Ambulatory Visit: Payer: BC Managed Care – PPO | Admitting: Nurse Practitioner

## 2023-01-24 DIAGNOSIS — H35372 Puckering of macula, left eye: Secondary | ICD-10-CM | POA: Diagnosis not present

## 2023-02-02 DIAGNOSIS — Z124 Encounter for screening for malignant neoplasm of cervix: Secondary | ICD-10-CM | POA: Diagnosis not present

## 2023-02-02 DIAGNOSIS — Z01419 Encounter for gynecological examination (general) (routine) without abnormal findings: Secondary | ICD-10-CM | POA: Diagnosis not present

## 2023-02-07 ENCOUNTER — Ambulatory Visit (INDEPENDENT_AMBULATORY_CARE_PROVIDER_SITE_OTHER): Payer: BC Managed Care – PPO | Admitting: Nurse Practitioner

## 2023-02-07 ENCOUNTER — Encounter: Payer: Self-pay | Admitting: Nurse Practitioner

## 2023-02-07 VITALS — BP 136/84 | HR 79 | Temp 98.8°F | Ht 67.72 in | Wt 238.4 lb

## 2023-02-07 DIAGNOSIS — Z13220 Encounter for screening for lipoid disorders: Secondary | ICD-10-CM | POA: Diagnosis not present

## 2023-02-07 DIAGNOSIS — E042 Nontoxic multinodular goiter: Secondary | ICD-10-CM

## 2023-02-07 DIAGNOSIS — Z0000 Encounter for general adult medical examination without abnormal findings: Secondary | ICD-10-CM

## 2023-02-07 DIAGNOSIS — E669 Obesity, unspecified: Secondary | ICD-10-CM | POA: Insufficient documentation

## 2023-02-07 DIAGNOSIS — C541 Malignant neoplasm of endometrium: Secondary | ICD-10-CM

## 2023-02-07 DIAGNOSIS — I4891 Unspecified atrial fibrillation: Secondary | ICD-10-CM

## 2023-02-07 DIAGNOSIS — Z8679 Personal history of other diseases of the circulatory system: Secondary | ICD-10-CM | POA: Diagnosis not present

## 2023-02-07 DIAGNOSIS — B001 Herpesviral vesicular dermatitis: Secondary | ICD-10-CM | POA: Insufficient documentation

## 2023-02-07 DIAGNOSIS — Z0001 Encounter for general adult medical examination with abnormal findings: Secondary | ICD-10-CM

## 2023-02-07 DIAGNOSIS — Z131 Encounter for screening for diabetes mellitus: Secondary | ICD-10-CM | POA: Diagnosis not present

## 2023-02-07 LAB — URINALYSIS, ROUTINE W REFLEX MICROSCOPIC
Bilirubin Urine: NEGATIVE
Hgb urine dipstick: NEGATIVE
Ketones, ur: 15 — AB
Leukocytes,Ua: NEGATIVE
Nitrite: NEGATIVE
RBC / HPF: NONE SEEN (ref 0–?)
Specific Gravity, Urine: 1.025 (ref 1.000–1.030)
Total Protein, Urine: NEGATIVE
Urine Glucose: NEGATIVE
Urobilinogen, UA: 1 (ref 0.0–1.0)
pH: 6 (ref 5.0–8.0)

## 2023-02-07 LAB — CBC WITH DIFFERENTIAL/PLATELET
Basophils Absolute: 0.1 10*3/uL (ref 0.0–0.1)
Basophils Relative: 0.9 % (ref 0.0–3.0)
Eosinophils Absolute: 0.2 10*3/uL (ref 0.0–0.7)
Eosinophils Relative: 2.3 % (ref 0.0–5.0)
HCT: 41.3 % (ref 36.0–46.0)
Hemoglobin: 14 g/dL (ref 12.0–15.0)
Lymphocytes Relative: 26.2 % (ref 12.0–46.0)
Lymphs Abs: 2.1 10*3/uL (ref 0.7–4.0)
MCHC: 33.8 g/dL (ref 30.0–36.0)
MCV: 91.1 fl (ref 78.0–100.0)
Monocytes Absolute: 0.5 10*3/uL (ref 0.1–1.0)
Monocytes Relative: 6.6 % (ref 3.0–12.0)
Neutro Abs: 5.1 10*3/uL (ref 1.4–7.7)
Neutrophils Relative %: 64 % (ref 43.0–77.0)
Platelets: 251 10*3/uL (ref 150.0–400.0)
RBC: 4.53 Mil/uL (ref 3.87–5.11)
RDW: 13.5 % (ref 11.5–15.5)
WBC: 8 10*3/uL (ref 4.0–10.5)

## 2023-02-07 LAB — COMPREHENSIVE METABOLIC PANEL
ALT: 26 U/L (ref 0–35)
AST: 18 U/L (ref 0–37)
Albumin: 4.5 g/dL (ref 3.5–5.2)
Alkaline Phosphatase: 87 U/L (ref 39–117)
BUN: 14 mg/dL (ref 6–23)
CO2: 24 mEq/L (ref 19–32)
Calcium: 9.5 mg/dL (ref 8.4–10.5)
Chloride: 103 mEq/L (ref 96–112)
Creatinine, Ser: 0.65 mg/dL (ref 0.40–1.20)
GFR: 96.06 mL/min (ref >60.00)
Glucose, Bld: 90 mg/dL (ref 70–99)
Potassium: 4 mEq/L (ref 3.5–5.1)
Sodium: 136 mEq/L (ref 135–145)
Total Bilirubin: 0.6 mg/dL (ref 0.2–1.2)
Total Protein: 7.1 g/dL (ref 6.0–8.3)

## 2023-02-07 LAB — TSH: TSH: 0.48 u[IU]/mL (ref 0.35–5.50)

## 2023-02-07 LAB — LIPID PANEL
Cholesterol: 172 mg/dL (ref 0–200)
HDL: 40 mg/dL (ref >39.00)
LDL Cholesterol: 103 mg/dL — ABNORMAL HIGH (ref 0–99)
NonHDL: 131.59
Total CHOL/HDL Ratio: 4
Triglycerides: 141 mg/dL (ref 0.0–149.0)
VLDL: 28.2 mg/dL (ref 0.0–40.0)

## 2023-02-07 LAB — HEMOGLOBIN A1C: Hgb A1c MFr Bld: 5.7 % (ref 4.6–6.5)

## 2023-02-07 LAB — VITAMIN D 25 HYDROXY (VIT D DEFICIENCY, FRACTURES): VITD: 33.12 ng/mL (ref 30.00–100.00)

## 2023-02-07 MED ORDER — VALACYCLOVIR HCL 1 G PO TABS
1000.0000 mg | ORAL_TABLET | Freq: Two times a day (BID) | ORAL | 0 refills | Status: DC
Start: 2023-02-07 — End: 2023-06-08

## 2023-02-07 NOTE — Assessment & Plan Note (Addendum)
Physical exam complete. Lab work as outlined. Will contact patient with results. Pap- Hysterectomy. Colonoscopy- UTD per patient. Mammogram- UTD. Flu and tetanus vaccines- UTD. Shingles vaccine- Completed series. Declined additional COVID vaccines. HIV/Hep C screenings declined today. Recommended establishing with Dentist for annual exam and following up with Ophthalmology as scheduled. Encouraged to continue working on healthy diet and exercise. Return to care in 6 months, sooner PRN.

## 2023-02-07 NOTE — Assessment & Plan Note (Signed)
Reports one occurrence in 2013- was cardioverted with oral medications. Has not had an episode since. Denies chest pain. Denies shortness of breath. Denies palpitations. She has never seen Cardiology. Regular rate and rhythm today on exam. Will continue to monitor.

## 2023-02-07 NOTE — Progress Notes (Signed)
Bethanie Dicker, NP-C Phone: [phone]  Melinda Carlson is a 62 y.o. female who presents today to establish care. She is followed closely by Endocrinology for a multinodular goiter. She is taking Levothyroxine daily. She reports having a recent increase in daily stressors and has since noticed she is having more fever blisters.   Diet: Working on it, trying to improve, making protein shakes with fruit, loves vegetables Exercise: Walking for 30 minutes per day Pap smear: Hysterectomy Colonoscopy: Patient unsure if it was 2017 or 2018- 10 year recall Mammogram: March 2024 Family history-  Colon cancer: No  Breast cancer: Yes- mother  Ovarian cancer: Yes- aunt  Patient has had BRCA genetic testing performed- Negative Menses: Hysterectomy Sexually active: Yes Vaccines-   Flu: Not due  Tetanus: Reports she is UTD but unsure of exact date  Shingles: Completed  COVID19: x 3 HIV screening: Declined Hep C Screening: Declined Tobacco use: No Alcohol use: Rarely- special occasions Illicit Drug use: No Dentist: No Ophthalmology: Yes- closely monitored   Active Ambulatory Problems    Diagnosis Date Noted   Endometrial ca (HCC) 04/25/2012   Atrial fibrillation (HCC) 05/02/2012   Vasomotor instability 04/24/2013   Multinodular goiter 03/05/2018   Preventative health care 02/07/2023   Obesity (BMI 30-39.9) 02/07/2023   Herpes labialis 02/07/2023   Resolved Ambulatory Problems    Diagnosis Date Noted   Gallstones 02/28/2011   Past Medical History:  Diagnosis Date   Chicken pox    Family history of breast cancer    GERD (gastroesophageal reflux disease)    History of uterine cancer    Multiple thyroid nodules    Prediabetes    UTI (urinary tract infection)     Family History  Problem Relation Age of Onset   Early death Mother    Asthma Mother    Cancer Mother        Breast   Hypertension Father    Hyperlipidemia Father    Hearing loss Father    Diabetes Father     Heart disease Father 28       CABG, Died age 25   Hearing loss Sister    Diabetes Maternal Grandmother    Heart disease Paternal Grandmother     Social History   Socioeconomic History   Marital status: Married    Spouse name: Not on file   Number of children: 3   Years of education: Not on file   Highest education level: Not on file  Occupational History   Occupation: [employer]    Employer: [employer]  Tobacco Use   Smoking status: Never   Smokeless tobacco: Not on file  Substance and Sexual Activity   Alcohol use: Yes    Comment: socially   Drug use: No   Sexual activity: Yes  Other Topics Concern   Not on file  Social History Narrative   Lives at home with husband and 3 sons.   Social Determinants of Health   Financial Resource Strain: Not on file  Food Insecurity: Not on file  Transportation Needs: Not on file  Physical Activity: Not on file  Stress: Not on file  Social Connections: Not on file  Intimate Partner Violence: Not on file    ROS  General:  Negative for unexplained weight loss, fever Skin: Negative for new or changing mole, sore that won't heal HEENT: Negative for trouble hearing, trouble seeing, ringing in ears, hoarseness, change in voice, dysphagia. CV:  Negative for chest pain,  dyspnea, edema, palpitations Resp: Negative for cough, dyspnea, hemoptysis GI: Negative for nausea, vomiting, diarrhea, constipation, abdominal pain, melena, hematochezia. GU: Negative for dysuria, incontinence, urinary hesitance, hematuria, vaginal or penile discharge, polyuria, sexual difficulty, lumps in testicle or breasts MSK: Negative for muscle cramps or aches, joint pain or swelling Neuro: Negative for headaches, weakness, numbness, dizziness, passing out/fainting Psych: Negative for depression, anxiety, memory problems  Objective  Physical Exam Vitals:   02/07/23 0954  BP: 136/84  Pulse: 79  Temp: 98.8 F (37.1 C)  SpO2: 98%     BP Readings from Last 3 Encounters:  02/07/23 136/84  07/01/13 (!) 160/100  04/24/13 122/80   Wt Readings from Last 3 Encounters:  02/07/23 238 lb 6.4 oz (108.1 kg)  07/01/13 234 lb 12.8 oz (106.5 kg)  04/24/13 232 lb 14.4 oz (105.6 kg)    Physical Exam Constitutional:      General: She is not in acute distress.    Appearance: Normal appearance.  HENT:     Head: Normocephalic.     Right Ear: Tympanic membrane normal.     Left Ear: Tympanic membrane normal.     Nose: Nose normal.     Mouth/Throat:     Mouth: Mucous membranes are moist.     Pharynx: Oropharynx is clear.  Eyes:     Conjunctiva/sclera: Conjunctivae normal.     Pupils: Pupils are equal, round, and reactive to light.  Neck:     Thyroid: No thyromegaly.  Cardiovascular:     Rate and Rhythm: Normal rate and regular rhythm.     Heart sounds: Normal heart sounds.  Pulmonary:     Effort: Pulmonary effort is normal.     Breath sounds: Normal breath sounds.  Abdominal:     General: Abdomen is flat. Bowel sounds are normal.     Palpations: Abdomen is soft. There is no mass.     Tenderness: There is no abdominal tenderness.  Musculoskeletal:        General: Normal range of motion.  Lymphadenopathy:     Cervical: No cervical adenopathy.  Skin:    General: Skin is warm and dry.     Findings: No rash.  Neurological:     General: No focal deficit present.     Mental Status: She is alert.  Psychiatric:        Mood and Affect: Mood normal.        Behavior: Behavior normal.    Assessment/Plan:   Preventative health care Assessment & Plan: Physical exam complete. Lab work as outlined. Will contact patient with results. Pap- Hysterectomy. Colonoscopy- UTD per patient. Mammogram- UTD. Flu and tetanus vaccines- UTD. Shingles vaccine- Completed series. Declined additional COVID vaccines. HIV/Hep C screenings declined today. Recommended establishing with Dentist for annual exam and following up with Ophthalmology  as scheduled. Encouraged to continue working on healthy diet and exercise. Return to care in 6 months, sooner PRN.   Orders: -     CBC with Differential/Platelet -     Comprehensive metabolic panel -     VITAMIN D 25 Hydroxy (Vit-D Deficiency, Fractures) -     Urinalysis, Routine w reflex microscopic  Herpes labialis Assessment & Plan: Recent increase in breakouts over the last 2-3 months due to increased stressors. Will treat with Valacyclovir 1,000 mg BID x 1 day. Encouraged patient to start medication as soon as symptoms begin. Advised to contact if occurring more frequently, changing or worsening symptoms to discuss suppression medication. Information provided. Will continue  to monitor.   Orders: -     valACYclovir HCl; Take 1 tablet (1,000 mg total) by mouth 2 (two) times daily. X 1 day. Start ASAP after symptom onset.  Dispense: 20 tablet; Refill: 0  Multinodular goiter Assessment & Plan: Recent FNA. Continue Levothyroxine 25 mcg daily. Follow up with Endocrinology.   Orders: -     TSH  Endometrial ca [facility]) Assessment & Plan: No longer seeing Oncology. Had Hysterectomy. Followed by Gynecology. Recent appointment last week. Continue follow ups as scheduled.    Atrial fibrillation, unspecified type [facility]) Assessment & Plan: Reports one occurrence in 2013- was cardioverted with oral medications. Has not had an episode since. Denies chest pain. Denies shortness of breath. Denies palpitations. She has never seen Cardiology. Regular rate and rhythm today on exam. Will continue to monitor.    Orders: -     CBC with Differential/Platelet  Obesity (BMI 30-39.9) -     Hemoglobin A1c  Lipid screening -     Lipid panel    Return in about 6 months (around 08/10/2023) for Follow up.   Bethanie Dicker, NP-C [facility] - [employer]

## 2023-02-07 NOTE — Assessment & Plan Note (Signed)
Recent increase in breakouts over the last 2-3 months due to increased stressors. Will treat with Valacyclovir 1,000 mg BID x 1 day. Encouraged patient to start medication as soon as symptoms begin. Advised to contact if occurring more frequently, changing or worsening symptoms to discuss suppression medication. Information provided. Will continue to monitor.

## 2023-02-07 NOTE — Assessment & Plan Note (Signed)
Recent FNA. Continue Levothyroxine 25 mcg daily. Follow up with Endocrinology.

## 2023-02-07 NOTE — Assessment & Plan Note (Addendum)
No longer seeing Oncology. Had Hysterectomy. Followed by Gynecology. Recent appointment last week. Continue follow ups as scheduled.

## 2023-04-24 DIAGNOSIS — H26499 Other secondary cataract, unspecified eye: Secondary | ICD-10-CM | POA: Diagnosis not present

## 2023-04-24 DIAGNOSIS — H43391 Other vitreous opacities, right eye: Secondary | ICD-10-CM | POA: Diagnosis not present

## 2023-04-24 DIAGNOSIS — H40003 Preglaucoma, unspecified, bilateral: Secondary | ICD-10-CM | POA: Diagnosis not present

## 2023-04-24 DIAGNOSIS — H35372 Puckering of macula, left eye: Secondary | ICD-10-CM | POA: Diagnosis not present

## 2023-06-08 ENCOUNTER — Other Ambulatory Visit: Payer: Self-pay | Admitting: Nurse Practitioner

## 2023-06-08 DIAGNOSIS — B001 Herpesviral vesicular dermatitis: Secondary | ICD-10-CM

## 2023-06-08 MED ORDER — VALACYCLOVIR HCL 1 G PO TABS
1000.0000 mg | ORAL_TABLET | Freq: Two times a day (BID) | ORAL | 0 refills | Status: AC
Start: 2023-06-08 — End: ?

## 2023-06-28 ENCOUNTER — Other Ambulatory Visit: Payer: Self-pay

## 2023-06-28 ENCOUNTER — Telehealth: Payer: Self-pay

## 2023-06-28 DIAGNOSIS — Z1231 Encounter for screening mammogram for malignant neoplasm of breast: Secondary | ICD-10-CM

## 2023-06-28 NOTE — Addendum Note (Signed)
Addended by: Donavan Foil on: 06/28/2023 10:35 AM   Modules accepted: Orders

## 2023-06-28 NOTE — Telephone Encounter (Signed)
Called pt in regards to overdue Mammo gram, pt informed ne that she recently had one with [facility]   I called [facility] and spoke to crystal and she provided me with the dates of the Mammo her Mammo has been updated in her chart and Ms. Crystal will be faxing over the reports as well.

## 2023-07-13 ENCOUNTER — Other Ambulatory Visit: Payer: Self-pay | Admitting: Nurse Practitioner

## 2023-07-13 DIAGNOSIS — Z1211 Encounter for screening for malignant neoplasm of colon: Secondary | ICD-10-CM

## 2023-07-13 DIAGNOSIS — Z1212 Encounter for screening for malignant neoplasm of rectum: Secondary | ICD-10-CM

## 2023-07-19 ENCOUNTER — Telehealth: Payer: Self-pay | Admitting: Nurse Practitioner

## 2023-07-19 NOTE — Telephone Encounter (Signed)
Pt called stating she received a color guard kit in the mail but she had a colonoscopy in 2020 with Dr Man in [place]

## 2023-07-19 NOTE — Telephone Encounter (Signed)
Called Dr. Kenna Gilbert office and confirmed pt received a colonoscopy in 2018 and he put her on a 10 year plan she is not due until 2028 for another one. Pt has been informed that she can discard the cologuard box because she is not due

## 2023-07-19 NOTE — Telephone Encounter (Signed)
Health maintenance has been updated reflecting colonoscopy every 10 years and 2018 date has been added

## 2023-08-10 ENCOUNTER — Ambulatory Visit (INDEPENDENT_AMBULATORY_CARE_PROVIDER_SITE_OTHER): Payer: BC Managed Care – PPO | Admitting: Nurse Practitioner

## 2023-08-10 ENCOUNTER — Encounter: Payer: Self-pay | Admitting: Nurse Practitioner

## 2023-08-10 VITALS — BP 128/88 | HR 77 | Temp 98.5°F | Ht 67.72 in | Wt 230.0 lb

## 2023-08-10 DIAGNOSIS — R252 Cramp and spasm: Secondary | ICD-10-CM | POA: Insufficient documentation

## 2023-08-10 DIAGNOSIS — B001 Herpesviral vesicular dermatitis: Secondary | ICD-10-CM | POA: Diagnosis not present

## 2023-08-10 DIAGNOSIS — D1721 Benign lipomatous neoplasm of skin and subcutaneous tissue of right arm: Secondary | ICD-10-CM | POA: Insufficient documentation

## 2023-08-10 DIAGNOSIS — R030 Elevated blood-pressure reading, without diagnosis of hypertension: Secondary | ICD-10-CM | POA: Diagnosis not present

## 2023-08-10 DIAGNOSIS — E042 Nontoxic multinodular goiter: Secondary | ICD-10-CM

## 2023-08-10 DIAGNOSIS — C541 Malignant neoplasm of endometrium: Secondary | ICD-10-CM

## 2023-08-10 LAB — BASIC METABOLIC PANEL
BUN: 17 mg/dL (ref 6–23)
CO2: 30 meq/L (ref 19–32)
Calcium: 9.4 mg/dL (ref 8.4–10.5)
Chloride: 105 meq/L (ref 96–112)
Creatinine, Ser: 0.68 mg/dL (ref 0.40–1.20)
GFR: 94.68 mL/min (ref >60.00)
Glucose, Bld: 98 mg/dL (ref 70–99)
Potassium: 4.7 meq/L (ref 3.5–5.1)
Sodium: 141 meq/L (ref 135–145)

## 2023-08-10 LAB — MAGNESIUM: Magnesium: 2.2 mg/dL (ref 1.5–2.5)

## 2023-08-10 NOTE — Assessment & Plan Note (Signed)
She recently experienced severe leg cramps, possibly due to dehydration or electrolyte imbalance, not on potassium supplements with previous normal potassium levels. We discussed potential causes and the importance of hydration and electrolyte balance. We will check electrolytes including potassium and magnesium, encourage hydration, and recommend continuing multivitamin supplementation.

## 2023-08-10 NOTE — Assessment & Plan Note (Signed)
Her increased cold sore breakouts are managed with Valtrex as needed, with only one breakout since the last visit, indicating good control. We discussed suppression therapy if breakouts increase. She will continue Valtrex as needed and consider suppression therapy if breakouts increase.

## 2023-08-10 NOTE — Assessment & Plan Note (Signed)
She is managed by endocrinology with levothyroxine 25 mcg without new symptoms. A follow-up with endocrinology is scheduled for February or March. She will continue levothyroxine 25 mcg and follow up with endocrinology.

## 2023-08-10 NOTE — Progress Notes (Signed)
Bethanie Dicker, NP-C Phone: [phone]  Melinda Carlson is a 62 y.o. female who presents today for follow up.   Discussed the use of AI scribe software for clinical note transcription with the patient, who gave verbal consent to proceed.  History of Present Illness   The patient, with a history of thyroid goiter and endometrial cancer, presents for a six-month follow-up. She reports overall good health, with no new symptoms or concerns related to her thyroid condition. She continues to take levothyroxine 25 mcg daily and is under the care of an endocrinologist, with a follow-up appointment scheduled for February or March. She denies experiencing any heart palpitations or temperature dysregulation.  The patient also reports a history of cold sores, with an increase in breakouts. She was started on Valtrex, to be taken for one day when symptoms appear. Since starting this regimen, she has only had one breakout, indicating good control of the condition.  The patient expresses concern about multiple cyst-like lesions on her arms, which have been present for several years without significant change in size or shape. She has a family history of similar lesions. She also expresses interest in having a full body scan and blood tests for tumor markers, due to her past history of cancer.  The patient also reports an episode of an allergic reaction two weeks ago, following consumption of a spicy margarita. The reaction was characterized by hives, bloating, and heat sensation, but resolved with administration of Benadryl and Pepcid.  The patient's blood pressure was noted to be slightly elevated at this visit, compared to her previous visit. She attributes this to increased stress due to work. She denies any symptoms of chest pain, shortness of breath, dizziness, swelling in the legs, vision changes, or headaches.  The patient also mentions occasional leg cramps, which she experienced during the allergic  reaction episode. She is unsure if these cramps are related to the reaction or if they are a separate issue. She expresses interest in having her electrolytes checked, including magnesium and potassium.      Social History   Tobacco Use  Smoking Status Never  Smokeless Tobacco Not on file    Current Outpatient Medications on File Prior to Visit  Medication Sig Dispense Refill   ascorbic acid (VITAMIN C) 250 MG tablet Take 1 tablet by mouth daily.     Calcium Citrate-Vitamin D (CAL-CITRATE PLUS VITAMIN D) 250-2.5 MG-MCG TABS Take by mouth.     cetirizine (ZYRTEC) 10 MG tablet Take 10 mg by mouth daily.     levothyroxine (SYNTHROID, LEVOTHROID) 25 MCG tablet Take 25 mcg by mouth daily.     Turmeric, Curcuma Longa, (CURCUMIN) POWD by Miscellaneous route every morning.     valACYclovir (VALTREX) 1000 MG tablet Take 1 tablet (1,000 mg total) by mouth 2 (two) times daily. X 1 day. Start ASAP after symptom onset. 20 tablet 0   No current facility-administered medications on file prior to visit.     ROS see history of present illness  Objective  Physical Exam Vitals:   08/10/23 0847 08/10/23 0912  BP: (!) 138/90 128/88  Pulse: 77   Temp: 98.5 F (36.9 C)   SpO2: 96%     BP Readings from Last 3 Encounters:  08/10/23 128/88  02/07/23 136/84  07/01/13 (!) 160/100   Wt Readings from Last 3 Encounters:  08/10/23 230 lb (104.3 kg)  02/07/23 238 lb 6.4 oz (108.1 kg)  07/01/13 234 lb 12.8 oz (106.5 kg)  Physical Exam Constitutional:      General: She is not in acute distress.    Appearance: Normal appearance.  HENT:     Head: Normocephalic.  Cardiovascular:     Rate and Rhythm: Normal rate and regular rhythm.     Heart sounds: Normal heart sounds.  Pulmonary:     Effort: Pulmonary effort is normal.     Breath sounds: Normal breath sounds.  Skin:    General: Skin is warm and dry.  Neurological:     General: No focal deficit present.     Mental Status: She is alert.   Psychiatric:        Mood and Affect: Mood normal.        Behavior: Behavior normal.    Assessment/Plan: Please see individual problem list.  Elevated blood pressure reading in office without diagnosis of hypertension Assessment & Plan: Her blood pressure readings were elevated at 136/84 mmHg previously and 138/90 mmHg today, attributed to a stressful week and lack of regular monitoring at home, without associated symptoms. We discussed the importance of home monitoring and the potential need for antihypertensive medication if readings remain elevated. We will start home blood pressure monitoring, provide a blood pressure log, and follow up in 4 weeks with the log to consider antihypertensive medication if home readings are consistently elevated.   Lipoma of right upper extremity Assessment & Plan: She has multiple small, non-growing lesions on her arms, likely lipomas, with concern due to a history of cancer, though lesions have been unchanged for several years. We discussed an ultrasound for further evaluation and potential biopsy based on findings. We will order a soft tissue ultrasound of arm lesions and consider biopsy based on ultrasound findings.  Orders: -     US SOFT TISSUE RT UPPER EXTREMITY LTD (NON-VASCULAR); Future  Leg cramps Assessment & Plan: She recently experienced severe leg cramps, possibly due to dehydration or electrolyte imbalance, not on potassium supplements with previous normal potassium levels. We discussed potential causes and the importance of hydration and electrolyte balance. We will check electrolytes including potassium and magnesium, encourage hydration, and recommend continuing multivitamin supplementation.  Orders: -     Basic metabolic panel -     Magnesium  Endometrial ca [facility])  Assessment & Plan: Due to history of endometrial cancer, patient concerned regarding other cancers. She expresses interest in having a full body scan and blood tests for  tumor markers. Will check CA 19-9 today. She is looking into private companies that perform full body scans since they are not covered by insurance.   Orders: -     Cancer antigen 19-9  Multinodular goiter Assessment & Plan: She is managed by endocrinology with levothyroxine 25 mcg without new symptoms. A follow-up with endocrinology is scheduled for February or March. She will continue levothyroxine 25 mcg and follow up with endocrinology.   Herpes labialis Assessment & Plan: Her increased cold sore breakouts are managed with Valtrex as needed, with only one breakout since the last visit, indicating good control. We discussed suppression therapy if breakouts increase. She will continue Valtrex as needed and consider suppression therapy if breakouts increase.   Return in about 4 weeks (around 09/07/2023) for Follow up.   Bethanie Dicker, NP-C [facility] - [employer]

## 2023-08-10 NOTE — Assessment & Plan Note (Signed)
She has multiple small, non-growing lesions on her arms, likely lipomas, with concern due to a history of cancer, though lesions have been unchanged for several years. We discussed an ultrasound for further evaluation and potential biopsy based on findings. We will order a soft tissue ultrasound of arm lesions and consider biopsy based on ultrasound findings.

## 2023-08-10 NOTE — Assessment & Plan Note (Signed)
Her blood pressure readings were elevated at 136/84 mmHg previously and 138/90 mmHg today, attributed to a stressful week and lack of regular monitoring at home, without associated symptoms. We discussed the importance of home monitoring and the potential need for antihypertensive medication if readings remain elevated. We will start home blood pressure monitoring, provide a blood pressure log, and follow up in 4 weeks with the log to consider antihypertensive medication if home readings are consistently elevated.

## 2023-08-10 NOTE — Assessment & Plan Note (Signed)
Due to history of endometrial cancer, patient concerned regarding other cancers. She expresses interest in having a full body scan and blood tests for tumor markers. Will check CA 19-9 today. She is looking into private companies that perform full body scans since they are not covered by insurance.

## 2023-08-13 LAB — CANCER ANTIGEN 19-9: CA 19-9: 9 U/mL (ref <34)

## 2023-08-22 ENCOUNTER — Telehealth: Payer: Self-pay | Admitting: Nurse Practitioner

## 2023-08-22 ENCOUNTER — Encounter: Payer: Self-pay | Admitting: Nurse Practitioner

## 2023-08-22 ENCOUNTER — Telehealth: Payer: Self-pay

## 2023-08-22 ENCOUNTER — Telehealth: Payer: BC Managed Care – PPO | Admitting: Nurse Practitioner

## 2023-08-22 NOTE — Telephone Encounter (Signed)
Spoke with pt and she stated she had a dizzy spell this am that took her back a few (she did not fall or faint) she stated after feeling like that she took her BP and it was 163/102 she stated 30 mins later it was 120/83.    I asked pt if it was  ever calibrated and she stated no she has never brought the cuff in. Pt stated she believes its related to stress a lot has happened her lifestyle has had to change.    She stated she will be sending in a few of her reading she has been keeping a record of her readings. She stated at her appt you asked her if she had dizziness and she was not even thinking about the times when she has felt that way by just laying down she stated its not all the time.    Pt stated she felt the dizziness while she was getting ready for the day she was standing and not getting up from a sitting position or from sitting up from laying down.   She denies nausea or headaches

## 2023-08-22 NOTE — Telephone Encounter (Signed)
  Symptoms: Dizzy spell. Checked BP 163/102     Attributing factors (medication changes, positional changes, etc. )  No changes to medication    Duration :     Pain Scale?  On 1-10 how woiuld you rate your pain? What makes it better or worse?      Blood pressure            Pulse             Temp           I called patient back and cancelled appointment. I transferred her to access nurse.

## 2023-08-23 ENCOUNTER — Ambulatory Visit
Admission: RE | Admit: 2023-08-23 | Discharge: 2023-08-23 | Disposition: A | Discharge status: Home / Self Care | Payer: BC Managed Care – PPO | Source: Ambulatory Visit | Attending: Nurse Practitioner | Admitting: Nurse Practitioner

## 2023-08-23 DIAGNOSIS — D1721 Benign lipomatous neoplasm of skin and subcutaneous tissue of right arm: Secondary | ICD-10-CM

## 2023-09-12 ENCOUNTER — Encounter: Payer: Self-pay | Admitting: Nurse Practitioner

## 2023-09-12 ENCOUNTER — Ambulatory Visit: Payer: BC Managed Care – PPO | Admitting: Nurse Practitioner

## 2023-09-12 VITALS — BP 130/82 | HR 76 | Temp 97.4°F | Ht 67.72 in | Wt 230.8 lb

## 2023-09-12 DIAGNOSIS — F439 Reaction to severe stress, unspecified: Secondary | ICD-10-CM | POA: Diagnosis not present

## 2023-09-12 DIAGNOSIS — R030 Elevated blood-pressure reading, without diagnosis of hypertension: Secondary | ICD-10-CM | POA: Diagnosis not present

## 2023-09-12 MED ORDER — HYDROXYZINE HCL 10 MG PO TABS: 10.0000 mg | ORAL_TABLET | Freq: Three times a day (TID) | ORAL | 1 refills | Status: DC | PRN

## 2023-09-12 NOTE — Assessment & Plan Note (Signed)
She reports high stress levels due to work and [employer]. We discussed the impact of stress on blood pressure and overall health. We will prescribe Hydroxyzine 10mg , 1-2 tablets as needed for stress, cautioning about potential drowsiness. Encouragement is given to continue self-care and stress management strategies. She will contact if symptoms persist despite medication.

## 2023-09-12 NOTE — Progress Notes (Signed)
Bethanie Dicker, NP-C Phone: [phone]  Melinda Carlson is a 62 y.o. female who presents today for follow up.   Discussed the use of AI scribe software for clinical note transcription with the patient, who gave verbal consent to proceed.  History of Present Illness   The patient, with a history of elevated blood pressure, presented for a four-week follow-up visit. She reported one blood pressure reading over 140, but nothing consistently over this threshold. The patient noted that these elevated readings often occurred during periods of stress. She denies any chest pain, shortness of breath, or dizziness.  The patient had an ultrasound on her arm, which was normal. She expressed concern about the impact of stress on her blood pressure and asked for a medication to take as needed during stressful situations. She has a history of a complete hysterectomy, which led to menopause and severe hot flashes. She had a negative experience with Celexa in the past, which she took daily for hot flashes, and reported feeling very ill if she missed a dose.      Social History   Tobacco Use  Smoking Status Never  Smokeless Tobacco Not on file    Current Outpatient Medications on File Prior to Visit  Medication Sig Dispense Refill   ascorbic acid (VITAMIN C) 250 MG tablet Take 1 tablet by mouth daily.     Calcium Citrate-Vitamin D (CAL-CITRATE PLUS VITAMIN D) 250-2.5 MG-MCG TABS Take by mouth.     cetirizine (ZYRTEC) 10 MG tablet Take 10 mg by mouth daily.     levothyroxine (SYNTHROID, LEVOTHROID) 25 MCG tablet Take 25 mcg by mouth daily.     Turmeric, Curcuma Longa, (CURCUMIN) POWD by Miscellaneous route every morning.     valACYclovir (VALTREX) 1000 MG tablet Take 1 tablet (1,000 mg total) by mouth 2 (two) times daily. X 1 day. Start ASAP after symptom onset. 20 tablet 0   No current facility-administered medications on file prior to visit.    ROS see history of present  illness  Objective  Physical Exam Vitals:   09/12/23 0846  BP: 130/82  Pulse: 76  Temp: (!) 97.4 F (36.3 C)  SpO2: 99%    BP Readings from Last 3 Encounters:  09/12/23 130/82  08/10/23 128/88  02/07/23 136/84   Wt Readings from Last 3 Encounters:  09/12/23 230 lb 12.8 oz (104.7 kg)  08/10/23 230 lb (104.3 kg)  02/07/23 238 lb 6.4 oz (108.1 kg)    Physical Exam Constitutional:      General: She is not in acute distress.    Appearance: Normal appearance.  HENT:     Head: Normocephalic.  Cardiovascular:     Rate and Rhythm: Normal rate and regular rhythm.     Heart sounds: Normal heart sounds.  Pulmonary:     Effort: Pulmonary effort is normal.     Breath sounds: Normal breath sounds.  Skin:    General: Skin is warm and dry.  Neurological:     General: No focal deficit present.     Mental Status: She is alert.  Psychiatric:        Mood and Affect: Mood normal.        Behavior: Behavior normal.    Assessment/Plan: Please see individual problem list.  Elevated blood pressure reading in office without diagnosis of hypertension Assessment & Plan: Blood pressure remains well controlled with occasional elevations likely attributed to stress, without any symptoms of end-organ damage. Stable today in office. We will continue current  management and self-monitoring of blood pressure. She will contact if remaining consistently elevated.   Stress Assessment & Plan: She reports high stress levels due to work and [employer]. We discussed the impact of stress on blood pressure and overall health. We will prescribe Hydroxyzine 10mg , 1-2 tablets as needed for stress, cautioning about potential drowsiness. Encouragement is given to continue self-care and stress management strategies. She will contact if symptoms persist despite medication.   Orders: -     hydrOXYzine HCl; Take 1-2 tablets (10-20 mg total) by mouth 3 (three) times daily as needed.  Dispense: 90  tablet; Refill: 1   Return in about 5 months (around 02/10/2024) for Annual Exam, sooner as needed.   Bethanie Dicker, NP-C [facility] - [facility]

## 2023-09-12 NOTE — Assessment & Plan Note (Signed)
Blood pressure remains well controlled with occasional elevations likely attributed to stress, without any symptoms of end-organ damage. Stable today in office. We will continue current management and self-monitoring of blood pressure. She will contact if remaining consistently elevated.

## 2023-09-20 ENCOUNTER — Other Ambulatory Visit: Payer: Self-pay | Admitting: Nurse Practitioner

## 2023-09-20 DIAGNOSIS — F439 Reaction to severe stress, unspecified: Secondary | ICD-10-CM

## 2023-12-03 DIAGNOSIS — E785 Hyperlipidemia, unspecified: Secondary | ICD-10-CM | POA: Diagnosis not present

## 2023-12-03 DIAGNOSIS — R7301 Impaired fasting glucose: Secondary | ICD-10-CM | POA: Diagnosis not present

## 2023-12-03 DIAGNOSIS — E049 Nontoxic goiter, unspecified: Secondary | ICD-10-CM | POA: Diagnosis not present

## 2023-12-26 DIAGNOSIS — Z1231 Encounter for screening mammogram for malignant neoplasm of breast: Secondary | ICD-10-CM | POA: Diagnosis not present

## 2024-01-25 DIAGNOSIS — H33002 Unspecified retinal detachment with retinal break, left eye: Secondary | ICD-10-CM | POA: Diagnosis not present

## 2024-01-25 DIAGNOSIS — Z961 Presence of intraocular lens: Secondary | ICD-10-CM | POA: Diagnosis not present

## 2024-01-25 DIAGNOSIS — H35372 Puckering of macula, left eye: Secondary | ICD-10-CM | POA: Diagnosis not present

## 2024-01-25 DIAGNOSIS — H40003 Preglaucoma, unspecified, bilateral: Secondary | ICD-10-CM | POA: Diagnosis not present

## 2024-02-05 ENCOUNTER — Telehealth: Payer: Self-pay | Admitting: Nurse Practitioner

## 2024-02-05 NOTE — Telephone Encounter (Signed)
 Patient need lab orders.

## 2024-02-12 ENCOUNTER — Encounter: Payer: Self-pay | Admitting: Nurse Practitioner

## 2024-02-12 ENCOUNTER — Ambulatory Visit: Payer: BC Managed Care – PPO | Admitting: Nurse Practitioner

## 2024-02-12 VITALS — BP 126/84 | HR 85 | Temp 98.5°F | Ht 67.72 in | Wt 235.0 lb

## 2024-02-12 DIAGNOSIS — Z13220 Encounter for screening for lipoid disorders: Secondary | ICD-10-CM | POA: Diagnosis not present

## 2024-02-12 DIAGNOSIS — E042 Nontoxic multinodular goiter: Secondary | ICD-10-CM

## 2024-02-12 DIAGNOSIS — E669 Obesity, unspecified: Secondary | ICD-10-CM

## 2024-02-12 DIAGNOSIS — Z0000 Encounter for general adult medical examination without abnormal findings: Secondary | ICD-10-CM

## 2024-02-12 LAB — LIPID PANEL
Cholesterol: 201 mg/dL — ABNORMAL HIGH (ref 0–200)
HDL: 42.3 mg/dL (ref >39.00)
LDL Cholesterol: 135 mg/dL — ABNORMAL HIGH (ref 0–99)
NonHDL: 159.09
Total CHOL/HDL Ratio: 5
Triglycerides: 118 mg/dL (ref 0.0–149.0)
VLDL: 23.6 mg/dL (ref 0.0–40.0)

## 2024-02-12 LAB — COMPREHENSIVE METABOLIC PANEL WITH GFR
ALT: 19 U/L (ref 0–35)
AST: 15 U/L (ref 0–37)
Albumin: 4.3 g/dL (ref 3.5–5.2)
Alkaline Phosphatase: 80 U/L (ref 39–117)
BUN: 15 mg/dL (ref 6–23)
CO2: 27 meq/L (ref 19–32)
Calcium: 9.3 mg/dL (ref 8.4–10.5)
Chloride: 105 meq/L (ref 96–112)
Creatinine, Ser: 0.66 mg/dL (ref 0.40–1.20)
GFR: 95.02 mL/min (ref >60.00)
Glucose, Bld: 106 mg/dL — ABNORMAL HIGH (ref 70–99)
Potassium: 4.3 meq/L (ref 3.5–5.1)
Sodium: 139 meq/L (ref 135–145)
Total Bilirubin: 0.5 mg/dL (ref 0.2–1.2)
Total Protein: 7.2 g/dL (ref 6.0–8.3)

## 2024-02-12 LAB — TSH: TSH: 0.74 u[IU]/mL (ref 0.35–5.50)

## 2024-02-12 LAB — CBC WITH DIFFERENTIAL/PLATELET
Basophils Absolute: 0.1 10*3/uL (ref 0.0–0.1)
Basophils Relative: 1.1 % (ref 0.0–3.0)
Eosinophils Absolute: 0.3 10*3/uL (ref 0.0–0.7)
Eosinophils Relative: 4.3 % (ref 0.0–5.0)
HCT: 41.6 % (ref 36.0–46.0)
Hemoglobin: 14.1 g/dL (ref 12.0–15.0)
Lymphocytes Relative: 27.2 % (ref 12.0–46.0)
Lymphs Abs: 1.9 10*3/uL (ref 0.7–4.0)
MCHC: 34 g/dL (ref 30.0–36.0)
MCV: 92.5 fl (ref 78.0–100.0)
Monocytes Absolute: 0.4 10*3/uL (ref 0.1–1.0)
Monocytes Relative: 6.3 % (ref 3.0–12.0)
Neutro Abs: 4.3 10*3/uL (ref 1.4–7.7)
Neutrophils Relative %: 61.1 % (ref 43.0–77.0)
Platelets: 262 10*3/uL (ref 150.0–400.0)
RBC: 4.5 Mil/uL (ref 3.87–5.11)
RDW: 12.9 % (ref 11.5–15.5)
WBC: 7 10*3/uL (ref 4.0–10.5)

## 2024-02-12 LAB — HEMOGLOBIN A1C: Hgb A1c MFr Bld: 5.7 % (ref 4.6–6.5)

## 2024-02-12 LAB — VITAMIN D 25 HYDROXY (VIT D DEFICIENCY, FRACTURES): VITD: 30.89 ng/mL (ref 30.00–100.00)

## 2024-02-12 NOTE — Progress Notes (Unsigned)
 Bluford Burkitt, NP-C Phone: [phone]  Melinda Carlson is a 63 y.o. female who presents today for annual exam.   Discussed the use of AI scribe software for clinical note transcription with the patient, who gave verbal consent to proceed.  History of Present Illness   Melinda Carlson is a 63 year old female who presents for an annual physical exam.  She has a history of thyroid  nodules and follows up with endocrinology. No symptoms such as heart palpitations or temperature issues are present.   Her blood pressure has been stable, with a recent reading of 116/75, and she monitors it regularly. No chest pain, shortness of breath, abdominal pain, urinary issues, or abnormal discharge. Occasional dizziness is attributed to possible sinus issues or dehydration, but it has resolved. No mood problems, anxiety, or depression, although she experiences work-related stress. Sleep is reported as good.  Her family history is significant for breast cancer, as her mother passed away from it. She has undergone genetic testing, which was negative. There is no family history of colon cancer.  She does not smoke and consumes alcohol only occasionally. She is up to date with her vaccinations, including tetanus, COVID-19, and shingles. She sees a dentist regularly but has not visited an eye doctor recently despite having had previous eye issues.  She has made dietary changes, including eating more salads and fruits, drinking unsweetened tea, and limiting herself to one Dr. Kathlene Paradise a day. She has resumed walking for exercise but was temporarily hindered by a cough she caught from her granddaughter.      Social History   Tobacco Use  Smoking Status Never  Smokeless Tobacco Not on file    Current Outpatient Medications on File Prior to Visit  Medication Sig Dispense Refill   ascorbic acid (VITAMIN C) 250 MG tablet Take 1 tablet by mouth daily.     Calcium Citrate-Vitamin D  (CAL-CITRATE PLUS VITAMIN D )  250-2.5 MG-MCG TABS Take by mouth.     cetirizine (ZYRTEC) 10 MG tablet Take 10 mg by mouth daily.     hydrOXYzine  (ATARAX ) 10 MG tablet TAKE 1-2 TABLETS (10-20 MG TOTAL) BY MOUTH 3 (THREE) TIMES DAILY AS NEEDED. 540 tablet 0   levothyroxine (SYNTHROID, LEVOTHROID) 25 MCG tablet Take 25 mcg by mouth daily.     Turmeric, Curcuma Longa, (CURCUMIN) POWD by Miscellaneous route every morning.     valACYclovir  (VALTREX ) 1000 MG tablet Take 1 tablet (1,000 mg total) by mouth 2 (two) times daily. X 1 day. Start ASAP after symptom onset. 20 tablet 0   No current facility-administered medications on file prior to visit.     ROS see history of present illness  Objective  Physical Exam Vitals:   02/12/24 0811  BP: 126/84  Pulse: 85  Temp: 98.5 F (36.9 C)  SpO2: 98%    BP Readings from Last 3 Encounters:  02/12/24 126/84  09/12/23 130/82  08/10/23 128/88   Wt Readings from Last 3 Encounters:  02/12/24 235 lb (106.6 kg)  09/12/23 230 lb 12.8 oz (104.7 kg)  08/10/23 230 lb (104.3 kg)    Physical Exam Constitutional:      General: She is not in acute distress.    Appearance: Normal appearance.  HENT:     Head: Normocephalic.     Right Ear: Tympanic membrane normal.     Left Ear: Tympanic membrane normal.     Nose: Nose normal.     Mouth/Throat:     Mouth: Mucous membranes are  moist.     Pharynx: Oropharynx is clear.  Eyes:     Conjunctiva/sclera: Conjunctivae normal.     Pupils: Pupils are equal, round, and reactive to light.  Neck:     Thyroid : No thyromegaly.  Cardiovascular:     Rate and Rhythm: Normal rate and regular rhythm.     Heart sounds: Normal heart sounds.  Pulmonary:     Effort: Pulmonary effort is normal.     Breath sounds: Normal breath sounds.  Abdominal:     General: Abdomen is flat. Bowel sounds are normal.     Palpations: Abdomen is soft. There is no mass.     Tenderness: There is no abdominal tenderness.  Musculoskeletal:        General: Normal  range of motion.  Lymphadenopathy:     Cervical: No cervical adenopathy.  Skin:    General: Skin is warm and dry.     Findings: No rash.  Neurological:     General: No focal deficit present.     Mental Status: She is alert.  Psychiatric:        Mood and Affect: Mood normal.        Behavior: Behavior normal.      Assessment/Plan: Please see individual problem list.  Preventative health care Assessment & Plan: Physical exam complete. We will order routine lab work as outlined and contact patient with results. Pap smear no longer indicated, s/p hysterectomy. Colonoscopy is up to date. Mammogram is up to date, with negative genetic testing for breast cancer. Tetanus vaccine is up to date and she has completed the shingles vaccine series. She declines the flu vaccine and any additional COVID vaccines. She occasionally uses alcohol, does not smoke or use drugs, maintains a healthy diet, and exercises regularly. Continue routine dental and eye exams. Return to care in one year, sooner as needed.   Orders: -     CBC with Differential/Platelet -     Comprehensive metabolic panel with GFR  Multinodular goiter Assessment & Plan: Managed by endocrinology with levothyroxine 25 mcg without new symptoms. Continue levothyroxine 25 mcg and follow up with endocrinology as scheduled.   Orders: -     TSH -     VITAMIN D  25 Hydroxy (Vit-D Deficiency, Fractures)  Obesity (BMI 30-39.9) -     Hemoglobin A1c  Lipid screening -     Lipid panel    Return in about 1 year (around 02/11/2025) for Annual Exam, sooner as needed.   Bluford Burkitt, NP-C [facility] - [facility]

## 2024-02-14 ENCOUNTER — Encounter: Payer: Self-pay | Admitting: Nurse Practitioner

## 2024-02-14 NOTE — Assessment & Plan Note (Signed)
 Managed by endocrinology with levothyroxine 25 mcg without new symptoms. Continue levothyroxine 25 mcg and follow up with endocrinology as scheduled.

## 2024-02-14 NOTE — Assessment & Plan Note (Signed)
 Physical exam complete. We will order routine lab work as outlined and contact patient with results. Pap smear no longer indicated, s/p hysterectomy. Colonoscopy is up to date. Mammogram is up to date, with negative genetic testing for breast cancer. Tetanus vaccine is up to date and she has completed the shingles vaccine series. She declines the flu vaccine and any additional COVID vaccines. She occasionally uses alcohol, does not smoke or use drugs, maintains a healthy diet, and exercises regularly. Continue routine dental and eye exams. Return to care in one year, sooner as needed.

## 2024-03-06 DIAGNOSIS — Z8669 Personal history of other diseases of the nervous system and sense organs: Secondary | ICD-10-CM | POA: Diagnosis not present

## 2024-03-06 DIAGNOSIS — H43391 Other vitreous opacities, right eye: Secondary | ICD-10-CM | POA: Diagnosis not present

## 2024-03-06 DIAGNOSIS — H35372 Puckering of macula, left eye: Secondary | ICD-10-CM | POA: Diagnosis not present

## 2024-03-06 DIAGNOSIS — H40003 Preglaucoma, unspecified, bilateral: Secondary | ICD-10-CM | POA: Diagnosis not present

## 2024-08-04 ENCOUNTER — Ambulatory Visit: Payer: Self-pay

## 2024-08-04 NOTE — Telephone Encounter (Signed)
 FYI Only or Action Required?: FYI only for provider.  Patient was last seen in primary care on 02/12/2024 by Gretel App, NP.  Called Nurse Triage reporting Dizziness.  Symptoms began several days ago.  Interventions attempted: Rest, hydration, or home remedies.  Symptoms are: stable.  Triage Disposition: See Physician Within 24 Hours  Patient/caregiver understands and will follow disposition?: Yes   Copied from CRM #[id number]. Topic: Clinical - Red Word Triage >> Aug 04, 2024  8:04 AM Berneda FALCON wrote: Red Word that prompted transfer to Nurse Triage: Patient states she has been having dizzy spells since last week (last Thurs morning). States it is better but still bothering her.  When she lays down, it is worse. Still lightheaded and when she bends down it is more. Reason for Disposition  [1] MODERATE dizziness (e.g., interferes with normal activities) AND [2] has NOT been evaluated by doctor (or NP/PA) for this  (Exception: Dizziness caused by heat exposure, sudden standing, or poor fluid intake.)  Answer Assessment - Initial Assessment Questions Denies cold/flu symptoms, chest pain, abnormal bleeding, numbness/weakness/tingling of arms or legs  1. DESCRIPTION: Describe your dizziness.     Feels like room is spinning - patient states it has improved since Thursday and she can walk around better 2. LIGHTHEADED: Do you feel lightheaded? (e.g., somewhat faint, woozy, weak upon standing)     No 3. VERTIGO: Do you feel like either you or the room is spinning or tilting? (i.e., vertigo)     Patient stated she felt like the room was spinning and it was worse with lying down 4. SEVERITY: How bad is it?  Do you feel like you are going to faint? Can you stand and walk?     Patient states when it happened she had to hold on to wall 5. ONSET:  When did the dizziness begin?     Thursday  6. AGGRAVATING FACTORS: Does anything make it worse? (e.g., standing, change in head  position)     Looking down and lying down makes it much worse. She also states it is worsened when she looks to the right 8. CAUSE: What do you think is causing the dizziness? (e.g., decreased fluids or food, diarrhea, emotional distress, heat exposure, new medicine, sudden standing, vomiting; unknown)     Patient has been diagnosed with vertigo years ago.  9. RECURRENT SYMPTOM: Have you had dizziness before? If Yes, ask: When was the last time? What happened that time?     Yes - it's been years since vertigo episode but has had episodes with sinus infections  10. OTHER SYMPTOMS: Do you have any other symptoms? (e.g., fever, chest pain, vomiting, diarrhea, bleeding) No  Protocols used: Dizziness - Lightheadedness-A-AH

## 2024-08-04 NOTE — Telephone Encounter (Signed)
 Noted

## 2024-08-05 ENCOUNTER — Encounter: Payer: Self-pay | Admitting: Family Medicine

## 2024-08-05 ENCOUNTER — Ambulatory Visit: Admitting: Family Medicine

## 2024-08-05 VITALS — BP 135/57 | HR 82 | Ht 67.72 in | Wt 241.2 lb

## 2024-08-05 DIAGNOSIS — Z79890 Hormone replacement therapy (postmenopausal): Secondary | ICD-10-CM | POA: Diagnosis not present

## 2024-08-05 DIAGNOSIS — I517 Cardiomegaly: Secondary | ICD-10-CM | POA: Diagnosis not present

## 2024-08-05 DIAGNOSIS — E039 Hypothyroidism, unspecified: Secondary | ICD-10-CM | POA: Diagnosis not present

## 2024-08-05 DIAGNOSIS — I482 Chronic atrial fibrillation, unspecified: Secondary | ICD-10-CM | POA: Diagnosis not present

## 2024-08-05 DIAGNOSIS — R000 Tachycardia, unspecified: Secondary | ICD-10-CM | POA: Diagnosis not present

## 2024-08-05 DIAGNOSIS — H8113 Benign paroxysmal vertigo, bilateral: Secondary | ICD-10-CM | POA: Diagnosis not present

## 2024-08-05 DIAGNOSIS — R918 Other nonspecific abnormal finding of lung field: Secondary | ICD-10-CM | POA: Diagnosis not present

## 2024-08-05 DIAGNOSIS — Z79899 Other long term (current) drug therapy: Secondary | ICD-10-CM | POA: Diagnosis not present

## 2024-08-05 DIAGNOSIS — J3089 Other allergic rhinitis: Secondary | ICD-10-CM

## 2024-08-05 DIAGNOSIS — I480 Paroxysmal atrial fibrillation: Secondary | ICD-10-CM | POA: Diagnosis not present

## 2024-08-05 DIAGNOSIS — R42 Dizziness and giddiness: Secondary | ICD-10-CM | POA: Diagnosis not present

## 2024-08-05 DIAGNOSIS — I10 Essential (primary) hypertension: Secondary | ICD-10-CM | POA: Diagnosis not present

## 2024-08-05 DIAGNOSIS — R110 Nausea: Secondary | ICD-10-CM | POA: Diagnosis not present

## 2024-08-05 DIAGNOSIS — E876 Hypokalemia: Secondary | ICD-10-CM | POA: Diagnosis not present

## 2024-08-05 DIAGNOSIS — Z7982 Long term (current) use of aspirin: Secondary | ICD-10-CM | POA: Diagnosis not present

## 2024-08-05 DIAGNOSIS — I4891 Unspecified atrial fibrillation: Secondary | ICD-10-CM | POA: Diagnosis not present

## 2024-08-05 DIAGNOSIS — I119 Hypertensive heart disease without heart failure: Secondary | ICD-10-CM | POA: Diagnosis not present

## 2024-08-05 DIAGNOSIS — Z886 Allergy status to analgesic agent status: Secondary | ICD-10-CM | POA: Diagnosis not present

## 2024-08-05 DIAGNOSIS — Z1152 Encounter for screening for COVID-19: Secondary | ICD-10-CM | POA: Diagnosis not present

## 2024-08-05 MED ORDER — MECLIZINE HCL 25 MG PO TABS: 12.5000 mg | ORAL_TABLET | Freq: Three times a day (TID) | ORAL | 0 refills | Status: DC | PRN

## 2024-08-05 NOTE — Progress Notes (Signed)
 Acute visit   Patient: Melinda Carlson   DOB: [birth year]   63 y.o. Female  MRN: [id number] PCP: Gretel App, NP   Chief Complaint  Patient presents with   Acute Visit    Dizzy spells / RN Triage 08/04/24. Patient reported she has been having dizzy spells since last week (last Thurs morning). States it is better but still bothering her. She reported it feels like the room is spinning but since it has been improving she can walk around better and that it becomes worse when laying down and looking down or to the right. No lightheaded but she does have to hold onto the wall when having spells. Previous dx'd with vertigo. Reports usually associated w/ sinus infection.     Dizziness    She reports some congestion currently with a mixture of yellow with minor green.    Subjective    Discussed the use of AI scribe software for clinical note transcription with the patient, who gave verbal consent to proceed.  History of Present Illness   Melinda Carlson is a 63 year old female who presents with dizziness and congestion.  Dizziness began Thursday morning with a spinning sensation, initially triggered by looking to the right. It has gradually improved but persists as lightheadedness, especially when lying down or looking down. She recalls a past episode of vertigo.  Chronic nasal congestion is attributed to allergies, with occasional yellow or green tinges, but mostly clear. She uses Zyrtec for severe symptoms and recently started Flonase.  Blood pressure readings have been lower than usual, with a diastolic reading of 57. Home readings are in the 120s over 70s range. She notes limited fluid intake in the mornings, which may contribute to lower readings.  She resides in [place]  and attributes allergy symptoms to the local environment.        Review of Systems  Objective    BP (!) 135/57 (BP Location: Left Arm, Patient Position: Sitting, Cuff Size: Large)   Pulse 82   Ht 5'  7.72 (1.72 m)   Wt 241 lb 3.2 oz (109.4 kg)   SpO2 97%   BMI 36.98 kg/m   Physical Exam   Physical Exam   HEENT: Ears normal, nasal mucosa swollen, no sinus tenderness CHEST: Clear to auscultation bilaterally        No results found for any visits on 08/05/24.  Assessment & Plan     Problem List Items Addressed This Visit   None Visit Diagnoses       Non-seasonal allergic rhinitis, unspecified trigger    -  Primary     Benign paroxysmal positional vertigo due to bilateral vestibular disorder               Vertigo Intermittent vertigo since Thursday morning, exacerbated by certain head movements, particularly when lying down. Symptoms have gradually improved but persist. Likely related to Eustachian tube dysfunction due to nasal congestion. No signs of infection. Potential for crystal displacement in the inner ear contributing to symptoms. - Prescribe meclizine 25 mg as needed for vertigo, with the option to take half a dose if drowsiness occurs. - Provide information on the Epley maneuver via MyChart for home management of vertigo. - Advise to contact PCP if symptoms do not improve.  Allergic rhinitis with nasal congestion Chronic nasal congestion with recent exacerbation, likely due to weather changes and allergies. Nasal examination shows swelling, but no signs of infection. Symptoms include occasional yellow and  green tinged nasal discharge, but mostly clear. Current management includes intermittent use of Zyrtec and recent use of Flonase. Eustachian tube congestion contributing to vertigo symptoms. - Continue Flonase, two sprays in each nostril once daily for the next couple of weeks. - Switch from Zyrtec to Allegra or Claritin if Zyrtec causes drowsiness. - Resume daily use of children's Allegra, as it was previously effective without causing drowsiness.       Meds ordered this encounter  Medications   meclizine (ANTIVERT) 25 MG tablet    Sig: Take 0.5-1 tablets  (12.5-25 mg total) by mouth 3 (three) times daily as needed for dizziness.    Dispense:  30 tablet    Refill:  0     Return if symptoms worsen or fail to improve.      Jon Eva, MD  [facility] [phone] (phone) [phone] (fax)  [facility]

## 2024-08-12 DIAGNOSIS — H35373 Puckering of macula, bilateral: Secondary | ICD-10-CM | POA: Diagnosis not present

## 2024-08-12 DIAGNOSIS — H33002 Unspecified retinal detachment with retinal break, left eye: Secondary | ICD-10-CM | POA: Diagnosis not present

## 2024-08-12 DIAGNOSIS — H40003 Preglaucoma, unspecified, bilateral: Secondary | ICD-10-CM | POA: Diagnosis not present

## 2024-08-12 DIAGNOSIS — H26493 Other secondary cataract, bilateral: Secondary | ICD-10-CM | POA: Diagnosis not present

## 2024-08-19 ENCOUNTER — Encounter: Payer: Self-pay | Admitting: Nurse Practitioner

## 2024-08-19 ENCOUNTER — Ambulatory Visit: Admitting: Nurse Practitioner

## 2024-08-19 VITALS — BP 138/82 | HR 65 | Temp 98.0°F | Ht 67.72 in | Wt 238.8 lb

## 2024-08-19 DIAGNOSIS — I480 Paroxysmal atrial fibrillation: Secondary | ICD-10-CM

## 2024-08-19 DIAGNOSIS — I10 Essential (primary) hypertension: Secondary | ICD-10-CM | POA: Insufficient documentation

## 2024-08-19 DIAGNOSIS — Z09 Encounter for follow-up examination after completed treatment for conditions other than malignant neoplasm: Secondary | ICD-10-CM | POA: Insufficient documentation

## 2024-08-19 DIAGNOSIS — E042 Nontoxic multinodular goiter: Secondary | ICD-10-CM

## 2024-08-19 NOTE — Progress Notes (Signed)
 Leron Glance, NP-C Phone: [phone]  Melinda Carlson is a 63 y.o. female who presents today for hospital follow up.   Discussed the use of AI scribe software for clinical note transcription with the patient, who gave verbal consent to proceed.  History of Present Illness   Melinda Carlson is a 63 year old female with atrial fibrillation who presents for a hospital follow-up after a recent episode of dizziness and rapid ventricular response.  She experienced dizziness beginning on August 01, 2024, prompting an emergency department visit. The dizziness occurs primarily when looking to the right or lying down, causing a sensation of spinning. Sleeping on the couch alleviates the symptoms. No associated nausea.  During the emergency department visit, she was found to be in atrial fibrillation with rapid ventricular response, with a heart rate reaching the 170s. She underwent a transesophageal echocardiogram and was cardioverted to normal rhythm. She was discharged on amiodarone, Pradaxa, and losartan.  She has a history of a similar episode in 2013 or 2014, where she was treated with medication and did not require electrical cardioversion. She is currently taking levothyroxine for thyroid  management and reports no changes in her thyroid  condition.  She mentions experiencing occasional indigestion, which she attributes to dietary factors, and notes that it has been improving. She also reports leg cramps in the past, but not recently, and denies any claudication or significant leg pain.  She is currently taking losartan for blood pressure management and reports no issues with chest pain or shortness of breath. She is also using various supplements, including beetroot powder and a multivitamin.      Social History   Tobacco Use  Smoking Status Never  Smokeless Tobacco Not on file    Current Outpatient Medications on File Prior to Visit  Medication Sig Dispense Refill   amiodarone  (PACERONE) 200 MG tablet Take 200 mg by mouth daily.     dabigatran (PRADAXA) 150 MG CAPS capsule Take 150 mg by mouth 2 (two) times daily.     fexofenadine (ALLEGRA ODT) 30 MG disintegrating tablet Take 30 mg by mouth daily.     hydrOXYzine  (ATARAX ) 10 MG tablet TAKE 1-2 TABLETS (10-20 MG TOTAL) BY MOUTH 3 (THREE) TIMES DAILY AS NEEDED. 540 tablet 0   levothyroxine (SYNTHROID, LEVOTHROID) 25 MCG tablet Take 25 mcg by mouth daily.     losartan (COZAAR) 25 MG tablet Take 25 mg by mouth daily.     valACYclovir  (VALTREX ) 1000 MG tablet Take 1 tablet (1,000 mg total) by mouth 2 (two) times daily. X 1 day. Start ASAP after symptom onset. 20 tablet 0   ascorbic acid (VITAMIN C) 250 MG tablet Take 1 tablet by mouth daily. (Patient not taking: Reported on 08/19/2024)     Calcium Citrate-Vitamin D  (CAL-CITRATE PLUS VITAMIN D ) 250-2.5 MG-MCG TABS Take by mouth. (Patient not taking: Reported on 08/19/2024)     No current facility-administered medications on file prior to visit.     ROS see history of present illness  Objective  Physical Exam Vitals:   08/19/24 1515  BP: 138/82  Pulse: 65  Temp: 98 F (36.7 C)  SpO2: 99%    BP Readings from Last 3 Encounters:  08/19/24 138/82  08/05/24 (!) 135/57  02/12/24 126/84   Wt Readings from Last 3 Encounters:  08/19/24 238 lb 12.8 oz (108.3 kg)  08/05/24 241 lb 3.2 oz (109.4 kg)  02/12/24 235 lb (106.6 kg)    Physical Exam Constitutional:  General: She is not in acute distress.    Appearance: Normal appearance.  HENT:     Head: Normocephalic.  Cardiovascular:     Rate and Rhythm: Normal rate and regular rhythm.     Heart sounds: Normal heart sounds.  Pulmonary:     Effort: Pulmonary effort is normal.     Breath sounds: Normal breath sounds.  Skin:    General: Skin is warm and dry.  Neurological:     General: No focal deficit present.     Mental Status: She is alert.  Psychiatric:        Mood and Affect: Mood normal.         Behavior: Behavior normal.      Assessment/Plan: Please see individual problem list.  Paroxysmal atrial fibrillation (HCC) Assessment & Plan: A recent episode was treated with transesophageal echo and DCCV on 10/29. She is on amiodarone and Pradaxa. NSR today. Ablation was discussed to reduce recurrence risk and potentially discontinue amiodarone. Continue amiodarone and Pradaxa. Follow up with electrophysiology for potential ablation. Monitor thyroid  function due to amiodarone. Strict precautions given to patient.    Hospital discharge follow-up Assessment & Plan: Discharge summary, notes, labs and medications reviewed today. Patient with no new concerns. Continue medication regimen. See plan for A-fib. Follow up with EP.    Hypertension, unspecified type Assessment & Plan: Started on Losartan 25 mg daily during hospital stay. Blood pressure adequately controlled today. Continue losartan 25 mg daily. We will continue to monitor.    Multinodular goiter Assessment & Plan: Managed by endocrinology with levothyroxine 25 mcg without new symptoms. TSH in hospital WNL. Monitoring is required due to amiodarone effects. Continue levothyroxine 25 mcg and follow up with endocrinology as scheduled.       Return for as scheduled.   Leron Glance, NP-C [facility] - [facility]

## 2024-08-19 NOTE — Assessment & Plan Note (Addendum)
 Started on Losartan 25 mg daily during hospital stay. Blood pressure adequately controlled today. Continue losartan 25 mg daily. We will continue to monitor.

## 2024-08-19 NOTE — Assessment & Plan Note (Addendum)
 Discharge summary, notes, labs and medications reviewed today. Patient with no new concerns. Continue medication regimen. See plan for A-fib. Follow up with EP.

## 2024-08-19 NOTE — Assessment & Plan Note (Signed)
 A recent episode was treated with transesophageal echo and DCCV on 10/29. She is on amiodarone and Pradaxa. NSR today. Ablation was discussed to reduce recurrence risk and potentially discontinue amiodarone. Continue amiodarone and Pradaxa. Follow up with electrophysiology for potential ablation. Monitor thyroid  function due to amiodarone. Strict precautions given to patient.

## 2024-08-19 NOTE — Assessment & Plan Note (Signed)
 Managed by endocrinology with levothyroxine 25 mcg without new symptoms. TSH in hospital WNL. Monitoring is required due to amiodarone effects. Continue levothyroxine 25 mcg and follow up with endocrinology as scheduled.

## 2024-08-21 DIAGNOSIS — I480 Paroxysmal atrial fibrillation: Secondary | ICD-10-CM | POA: Diagnosis not present

## 2024-10-03 ENCOUNTER — Ambulatory Visit: Payer: Self-pay | Admitting: *Deleted

## 2024-10-03 NOTE — Telephone Encounter (Signed)
 Summary: Question about dosage of medicine   Reason for Triage: Pradaxa- patient may have mistakenly taken two or she may have missed one. She's not sure. Usually when taking it, patient does start burping but then she's not sure. If she should skip the dose tonight just in case.  [phone] (M)          Reason for Disposition  [1] Caller has URGENT medicine question about med that primary care doctor (or NP/PA) or specialist prescribed AND [2] triager unable to answer question  Answer Assessment - Initial Assessment Questions 1. NAME of MEDICINE: What medicine(s) are you calling about?     dabigatran (PRADAXA) 150 MG CAPS capsule 2. QUESTION: What is your question? (e.g., double dose of medicine, side effect)     Patient normally takes am/pm dosing- but feels she may have taken her morning dose twice today. She is not sure  and her pill count is not reassuring to her.  3. PRESCRIBER: Who prescribed the medicine? Reason: if prescribed by specialist, call should be referred to that group.     Historical provider 4. SYMPTOMS: Do you have any symptoms? If Yes, ask: What symptoms are you having?  How bad are the symptoms (e.g., mild, moderate, severe)     none  Patient is not having symptoms- but wants to hold the pm dose because she is afraid of taking too much- it is blood thinner. Patient will resume medication in am. Discussed pill caddy for this medication- or even marking a calender. She will be more vigilant with recording her dosages.  Protocols used: Medication Question Call-A-AH

## 2024-10-03 NOTE — Telephone Encounter (Signed)
Pt informed

## 2024-10-20 ENCOUNTER — Other Ambulatory Visit: Payer: Self-pay | Admitting: Nurse Practitioner

## 2024-10-20 MED ORDER — AMIODARONE HCL 200 MG PO TABS: 200.0000 mg | ORAL_TABLET | Freq: Every day | ORAL | 1 refills | Status: AC

## 2024-10-20 MED ORDER — DABIGATRAN ETEXILATE MESYLATE 150 MG PO CAPS: 150.0000 mg | ORAL_CAPSULE | Freq: Two times a day (BID) | ORAL | 1 refills | Status: AC

## 2024-10-20 MED ORDER — LOSARTAN POTASSIUM 25 MG PO TABS: 25.0000 mg | ORAL_TABLET | Freq: Every day | ORAL | 3 refills | Status: AC

## 2024-10-20 NOTE — Telephone Encounter (Signed)
 Copied from CRM #[id number]. Topic: Clinical - Medication Refill >> Oct 20, 2024 12:25 PM Amber H wrote: Medication:  losartan  (COZAAR ) 25 MG tablet  dabigatran  (PRADAXA ) 150 MG CAPS capsule amiodarone  (PACERONE ) 200 MG tablet  Has the patient contacted their pharmacy? Yes, has no refills left.  (Agent: If no, request that the patient contact the pharmacy for the refill. If patient does not wish to contact the pharmacy document the reason why and proceed with request.) (Agent: If yes, when and what did the pharmacy advise?)  This is the patient's preferred pharmacy:  [id number] - [place], [place] - [address] AT [facility] [address] [place] [address] Phone: [phone] Fax: [phone]  Is this the correct pharmacy for this prescription? Yes If no, delete pharmacy and type the correct one.   Has the prescription been filled recently? Yes  Is the patient out of the medication? No.   Has the patient been seen for an appointment in the last year OR does the patient have an upcoming appointment? Yes, 08/19/2024  Can we respond through MyChart? Yes  Agent: Please be advised that Rx refills may take up to 3 business days. We ask that you follow-up with your pharmacy.

## 2024-10-29 ENCOUNTER — Telehealth: Payer: Self-pay | Admitting: Nurse Practitioner

## 2024-10-29 NOTE — Telephone Encounter (Signed)
 Copied from CRM #[id number]. Topic: Clinical - Medical Advice >> Oct 29, 2024  3:43 PM Delon DASEN wrote: Reason for CRM: Pharmacist was worried about interaction of medications and said it could affect kidneys, please call to advise [phone]

## 2024-11-05 ENCOUNTER — Other Ambulatory Visit: Payer: Self-pay | Admitting: Endocrinology

## 2024-11-05 DIAGNOSIS — E049 Nontoxic goiter, unspecified: Secondary | ICD-10-CM

## 2024-11-20 ENCOUNTER — Other Ambulatory Visit

## 2025-02-12 ENCOUNTER — Encounter: Admitting: Nurse Practitioner
